# Patient Record
Sex: Female | Born: 1935 | Race: White | Hispanic: No | Marital: Single | Smoking: Never smoker
Health system: Southern US, Community
[De-identification: ages and names within clinical notes are randomized; demographics above are authoritative.]

## PROBLEM LIST (undated history)

## (undated) DIAGNOSIS — G2401 Drug induced subacute dyskinesia: Secondary | ICD-10-CM

## (undated) DIAGNOSIS — E079 Disorder of thyroid, unspecified: Secondary | ICD-10-CM

## (undated) DIAGNOSIS — E785 Hyperlipidemia, unspecified: Secondary | ICD-10-CM

## (undated) DIAGNOSIS — K219 Gastro-esophageal reflux disease without esophagitis: Secondary | ICD-10-CM

## (undated) DIAGNOSIS — F419 Anxiety disorder, unspecified: Secondary | ICD-10-CM

## (undated) DIAGNOSIS — H409 Unspecified glaucoma: Secondary | ICD-10-CM

## (undated) DIAGNOSIS — I10 Essential (primary) hypertension: Secondary | ICD-10-CM

## (undated) HISTORY — DX: Essential (primary) hypertension: I10

## (undated) HISTORY — PX: CATARACT EXTRACTION: SUR2

## (undated) HISTORY — DX: Gastro-esophageal reflux disease without esophagitis: K21.9

## (undated) HISTORY — PX: TUBAL LIGATION: SHX77

## (undated) HISTORY — DX: Hyperlipidemia, unspecified: E78.5

---

## 2004-08-08 ENCOUNTER — Ambulatory Visit: Payer: Self-pay | Admitting: Family Medicine

## 2006-02-03 ENCOUNTER — Ambulatory Visit: Payer: Self-pay | Admitting: Gastroenterology

## 2006-11-11 ENCOUNTER — Ambulatory Visit: Payer: Self-pay | Admitting: Family Medicine

## 2007-11-20 ENCOUNTER — Ambulatory Visit: Payer: Self-pay | Admitting: Family Medicine

## 2008-12-08 ENCOUNTER — Ambulatory Visit: Payer: Self-pay | Admitting: Family Medicine

## 2009-02-14 ENCOUNTER — Observation Stay: Payer: Self-pay | Admitting: Internal Medicine

## 2009-12-11 ENCOUNTER — Ambulatory Visit: Payer: Self-pay | Admitting: Family Medicine

## 2010-11-30 ENCOUNTER — Ambulatory Visit: Payer: Self-pay

## 2010-12-11 ENCOUNTER — Ambulatory Visit: Payer: Self-pay | Admitting: Family Medicine

## 2010-12-11 ENCOUNTER — Emergency Department: Payer: Self-pay | Admitting: Emergency Medicine

## 2010-12-25 ENCOUNTER — Ambulatory Visit: Payer: Self-pay | Admitting: Family Medicine

## 2012-01-27 ENCOUNTER — Ambulatory Visit: Payer: Self-pay | Admitting: Family Medicine

## 2012-06-10 ENCOUNTER — Ambulatory Visit: Payer: Self-pay | Admitting: Ophthalmology

## 2012-06-10 IMAGING — CT CT HEAD WITHOUT CONTRAST
1 series · 16 of 29 positions shown, 20 images · non-contrast
Comparison: none

REASON FOR EXAM: CR [id number]  recent fall now with headache vision changes
eval bleed
COMMENTS:

[Series 2: soft tissue · axial · 0.39mm/px · z∈[+874,+1004]mm · 16 of 29 slices shown, 20 images]
[im 2/29  brain]
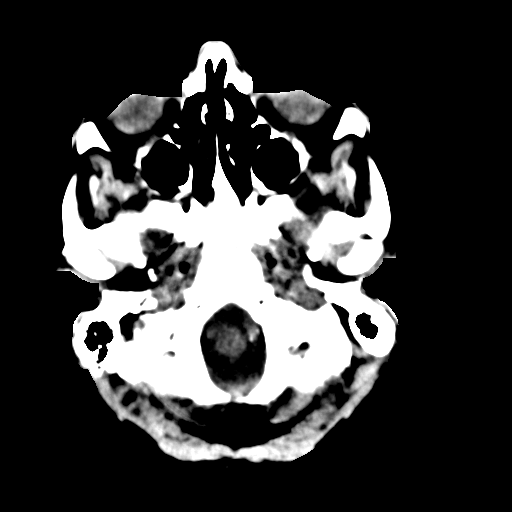
[im 2/29  bone]
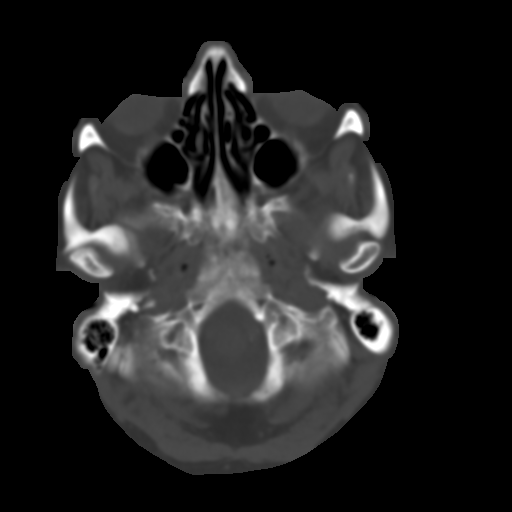
[im 4/29  brain]
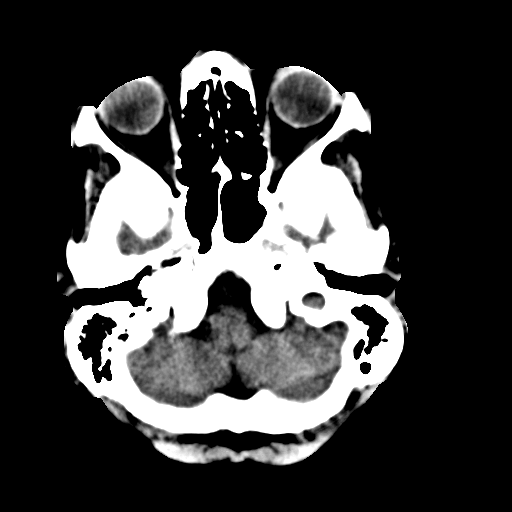
[im 6/29  brain]
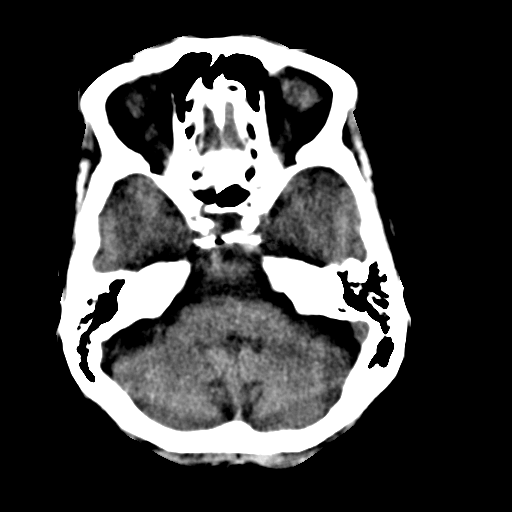
[im 7/29  brain]
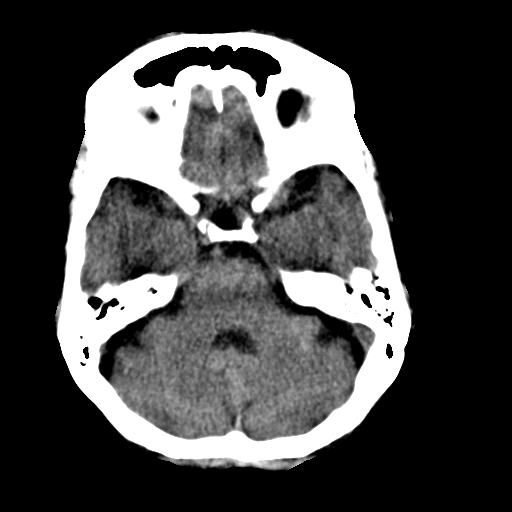
[im 9/29  brain]
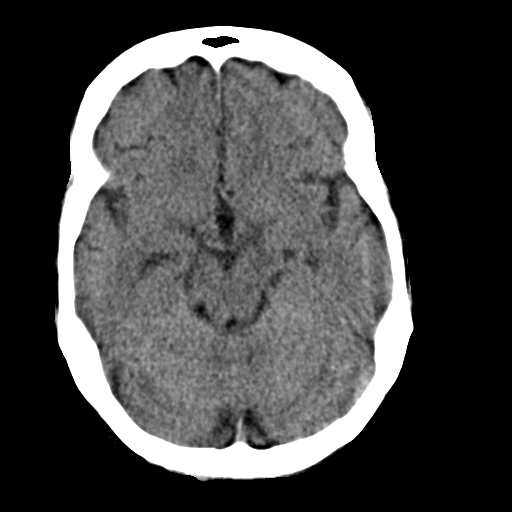
[im 9/29  bone]
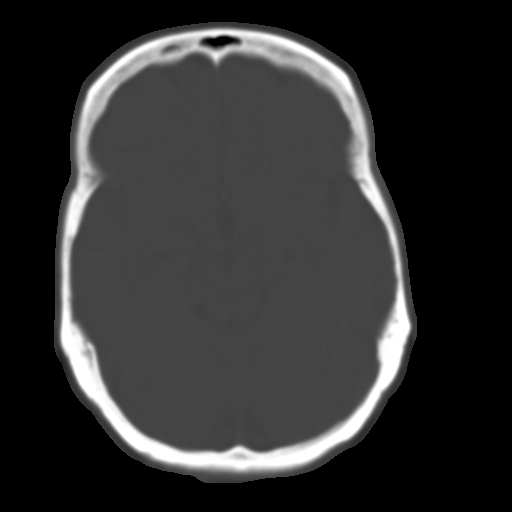
[im 11/29  brain]
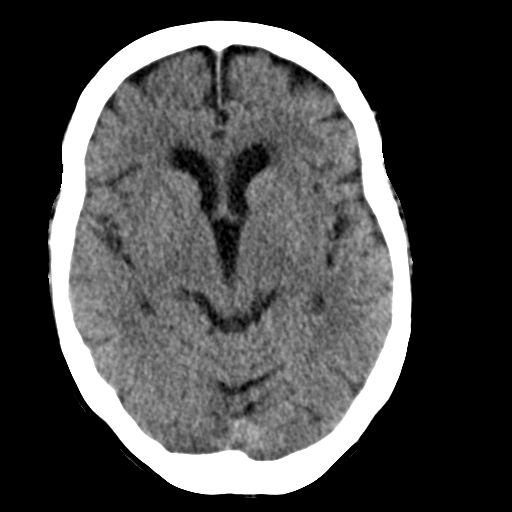
[im 12/29  brain]
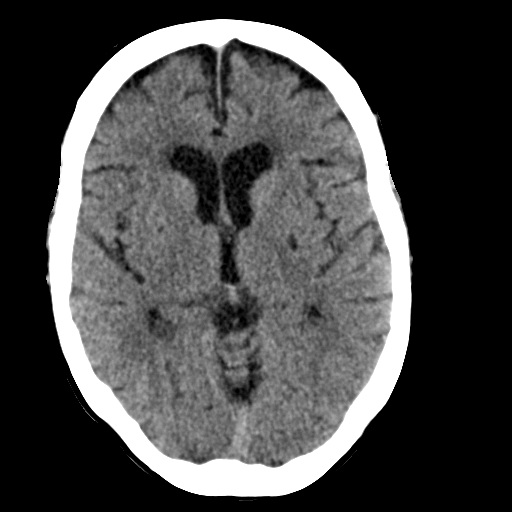
[im 14/29  brain]
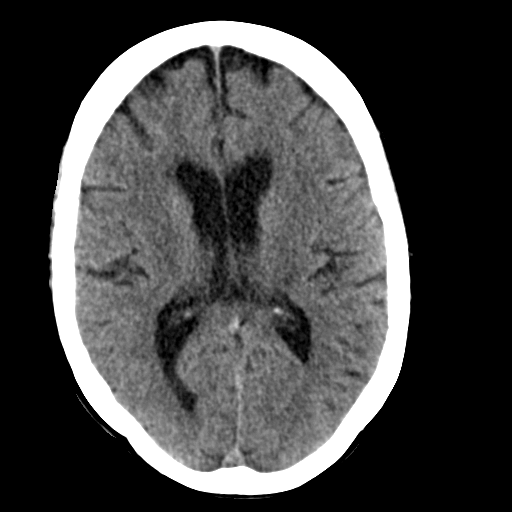
[im 16/29  brain]
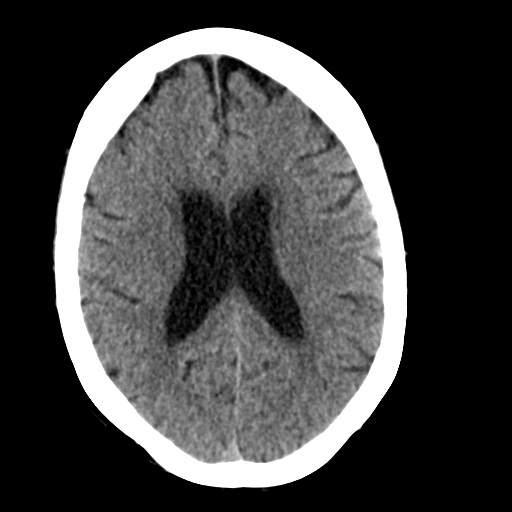
[im 16/29  bone]
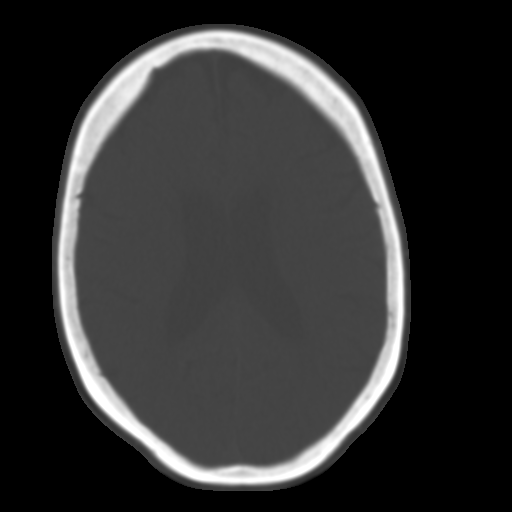
[im 18/29  brain]
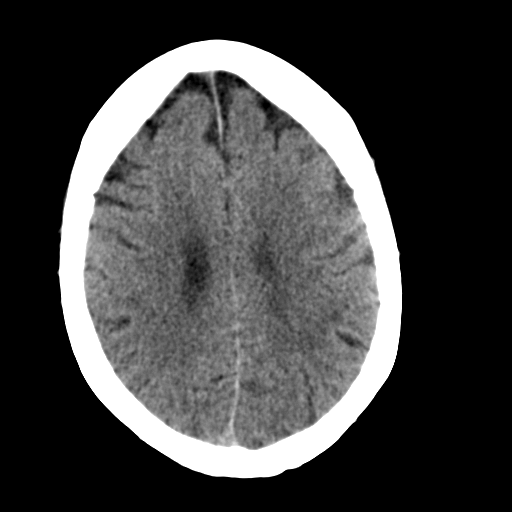
[im 19/29  brain]
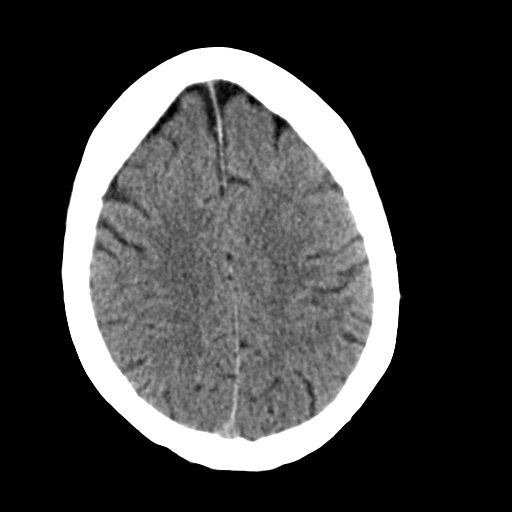
[im 21/29  brain]
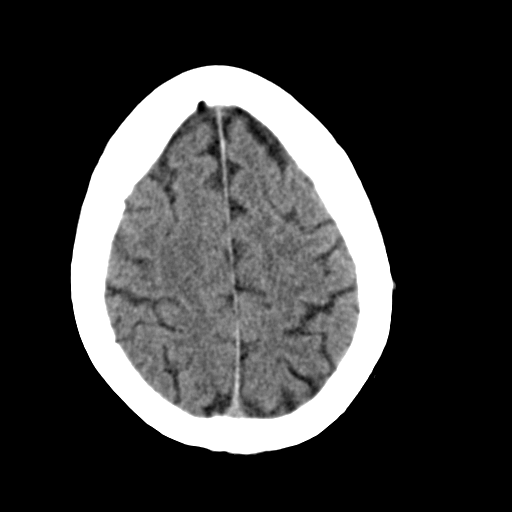
[im 23/29  brain]
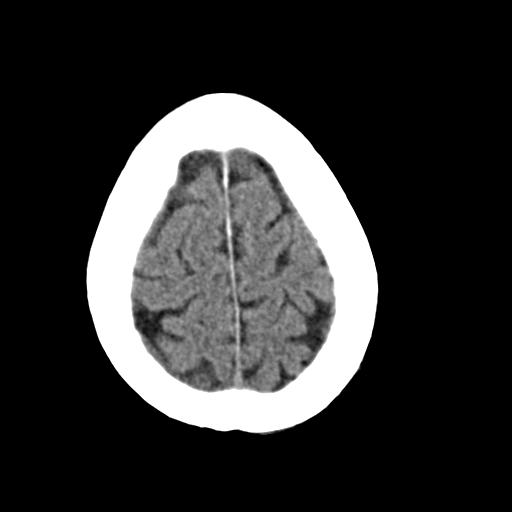
[im 23/29  bone]
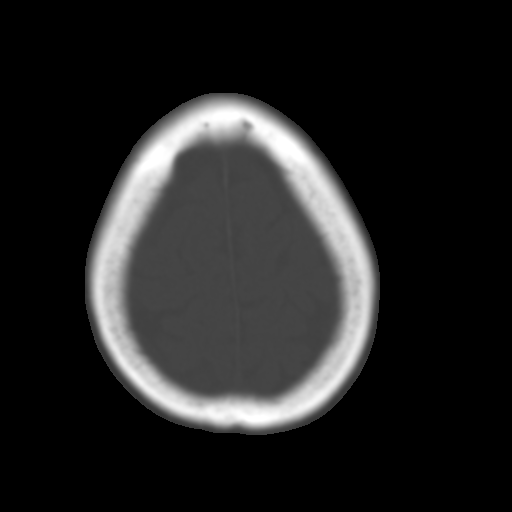
[im 24/29  brain]
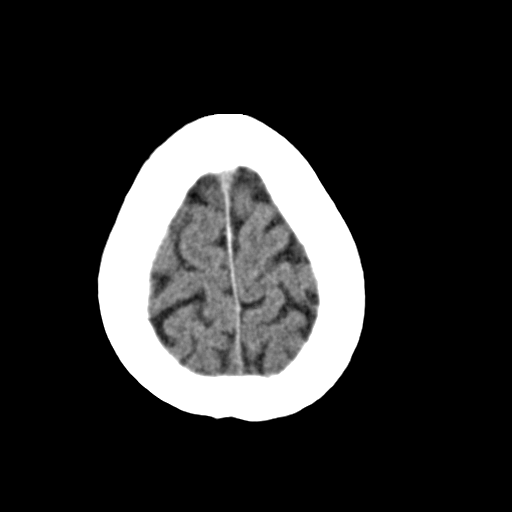
[im 26/29  brain]
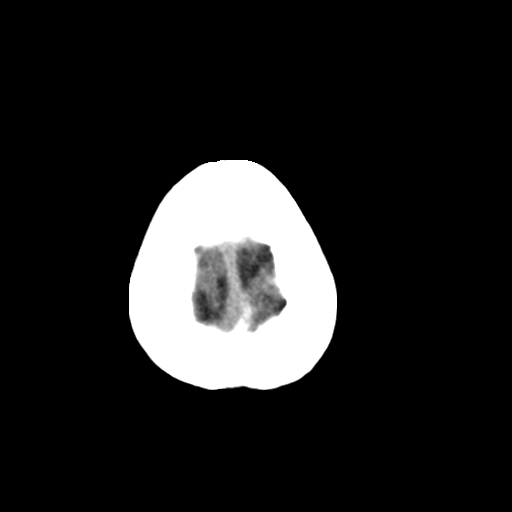
[im 28/29  brain]
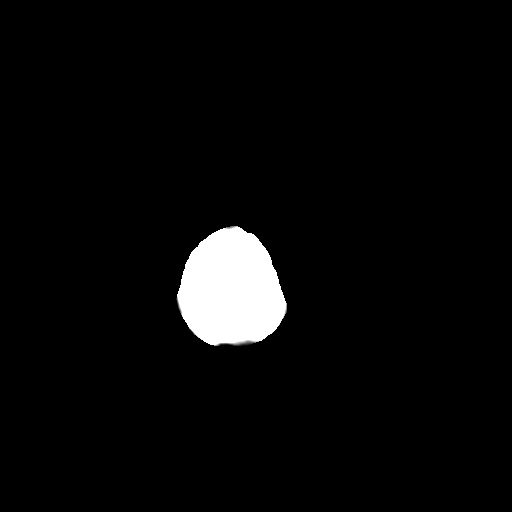

[16 of 29 positions shown; findings below may reference images not displayed]

PROCEDURE:     CT  - CT HEAD WITHOUT CONTRAST  - December 11, 2010 [DATE]

RESULT:     Noncontrast CT of the brain is performed in the standard
fashion. There is no previous exam for comparison.

There is mild diffuse prominence of the ventricles and sulci consistent with
atrophy appropriate for the patient's age. There is some mild
low-attenuation diffusely within the periventricular and subcortical white
matter which is nonspecific but most likely secondary to chronic small
vessel ischemic disease. There is no intracranial hemorrhage, mass effect or
midline shift. Small basal ganglia lacunar infarcts are present bilaterally.
The included sinuses and mastoid air cells demonstrate normal appearing
aeration. The calvarium is intact.
IMPRESSION: 1. Changes of atrophy with chronic small vessel ischemic disease. No acute
intracranial abnormality demonstrated.
2. Basal ganglia lacunar infarcts bilaterally.

## 2012-10-26 ENCOUNTER — Ambulatory Visit: Payer: Self-pay | Admitting: Family Medicine

## 2013-06-18 ENCOUNTER — Ambulatory Visit: Payer: Self-pay | Admitting: Family Medicine

## 2015-02-23 ENCOUNTER — Ambulatory Visit (INDEPENDENT_AMBULATORY_CARE_PROVIDER_SITE_OTHER): Payer: Commercial Managed Care - HMO | Admitting: Family Medicine

## 2015-02-23 ENCOUNTER — Telehealth: Payer: Self-pay | Admitting: Family Medicine

## 2015-02-23 ENCOUNTER — Encounter: Payer: Self-pay | Admitting: Family Medicine

## 2015-02-23 ENCOUNTER — Other Ambulatory Visit: Payer: Self-pay | Admitting: Family Medicine

## 2015-02-23 VITALS — BP 118/78 | HR 60 | Temp 97.7°F | Resp 16 | Ht 60.0 in | Wt 148.0 lb

## 2015-02-23 DIAGNOSIS — E039 Hypothyroidism, unspecified: Secondary | ICD-10-CM | POA: Diagnosis not present

## 2015-02-23 DIAGNOSIS — R296 Repeated falls: Secondary | ICD-10-CM

## 2015-02-23 DIAGNOSIS — E7800 Pure hypercholesterolemia, unspecified: Secondary | ICD-10-CM | POA: Insufficient documentation

## 2015-02-23 DIAGNOSIS — R739 Hyperglycemia, unspecified: Secondary | ICD-10-CM | POA: Insufficient documentation

## 2015-02-23 DIAGNOSIS — R0602 Shortness of breath: Secondary | ICD-10-CM | POA: Diagnosis not present

## 2015-02-23 DIAGNOSIS — K219 Gastro-esophageal reflux disease without esophagitis: Secondary | ICD-10-CM | POA: Insufficient documentation

## 2015-02-23 DIAGNOSIS — F32A Depression, unspecified: Secondary | ICD-10-CM | POA: Insufficient documentation

## 2015-02-23 DIAGNOSIS — N952 Postmenopausal atrophic vaginitis: Secondary | ICD-10-CM | POA: Insufficient documentation

## 2015-02-23 DIAGNOSIS — I10 Essential (primary) hypertension: Secondary | ICD-10-CM | POA: Diagnosis not present

## 2015-02-23 DIAGNOSIS — J309 Allergic rhinitis, unspecified: Secondary | ICD-10-CM | POA: Insufficient documentation

## 2015-02-23 DIAGNOSIS — G2401 Drug induced subacute dyskinesia: Secondary | ICD-10-CM

## 2015-02-23 DIAGNOSIS — M1990 Unspecified osteoarthritis, unspecified site: Secondary | ICD-10-CM | POA: Diagnosis not present

## 2015-02-23 DIAGNOSIS — F419 Anxiety disorder, unspecified: Secondary | ICD-10-CM | POA: Insufficient documentation

## 2015-02-23 DIAGNOSIS — F329 Major depressive disorder, single episode, unspecified: Secondary | ICD-10-CM | POA: Insufficient documentation

## 2015-02-23 DIAGNOSIS — H401190 Primary open-angle glaucoma, unspecified eye, stage unspecified: Secondary | ICD-10-CM | POA: Insufficient documentation

## 2015-02-23 DIAGNOSIS — G4700 Insomnia, unspecified: Secondary | ICD-10-CM | POA: Insufficient documentation

## 2015-02-23 DIAGNOSIS — E875 Hyperkalemia: Secondary | ICD-10-CM | POA: Insufficient documentation

## 2015-02-23 HISTORY — DX: Drug induced subacute dyskinesia: G24.01

## 2015-02-23 NOTE — Telephone Encounter (Signed)
TODAY AT 2:30

## 2015-02-23 NOTE — Progress Notes (Signed)
Subjective:    Patient ID: Katrina Guerrero, female    DOB: Jun 04, 1929, 81 y.o.   MRN: [id number]  HPI Comments: Pt has fallen 6-7 times in the last month. Pt states she is experiencing fatigue. States, "I have no energy. It's hard to pick up my leg". Pt's right leg is bothering her more than the left.  Fall The accident occurred 12 to 24 hours ago. She landed on carpet. There was no blood loss. The point of impact was the left shoulder. The patient is experiencing no pain. Pertinent negatives include no abdominal pain, bowel incontinence, fever, headaches, hematuria, loss of consciousness, nausea, numbness, tingling, visual change or vomiting.   Feels fine after event. Cognitively intact.      Review of Systems  Constitutional: Positive for fatigue. Negative for fever, chills, diaphoresis, activity change and appetite change.  Respiratory: Positive for shortness of breath (with exertion). Negative for cough.   Cardiovascular: Negative for chest pain.  Gastrointestinal: Negative for nausea, vomiting, abdominal pain and bowel incontinence.  Genitourinary: Negative for hematuria.  Neurological: Negative for tingling, loss of consciousness, numbness and headaches.   Patient Active Problem List   Diagnosis Date Noted  . Allergic rhinitis 02/23/2015  . Anxiety 02/23/2015  . Atrophic vaginitis 02/23/2015  . Chronic glaucoma 02/23/2015  . Clinical depression 02/23/2015  . Acid reflux 02/23/2015  . BP (high blood pressure) 02/23/2015  . Hypercholesteremia 02/23/2015  . Blood glucose elevated 02/23/2015  . High potassium 02/23/2015  . Adult hypothyroidism 02/23/2015  . Cannot sleep 02/23/2015  . Dyskinesia, tardive 02/23/2015  .   History reviewed. No pertinent past medical history. Past Surgical History  Procedure Laterality Date  . Tubal ligation      reports that she has never smoked. She has never used smokeless tobacco. She reports that she does not drink alcohol or use  illicit drugs. family history includes Emphysema in her brother; Heart attack in her father; Hypertension in her mother. Allergies  Allergen Reactions  . Codeine   . Furosemide     lowers blood pressure  . Levofloxacin     Other reaction(s): Dizziness Weakness    Objective:   Physical Exam  Constitutional: She is oriented to person, place, and time. She appears well-developed and well-nourished.  Musculoskeletal: She exhibits edema (+ 1 pitting edema).  Some decreased ROM at hips and knees. Strength decreased, 4 plus at arms and legs. In wheelchair today.   Neurological: She is alert and oriented to person, place, and time.  Psychiatric: She has a normal mood and affect.     Blood pressure 118/78, pulse 60, temperature 97.7 F (36.5 C), temperature source Oral, resp. rate 16, height 5' (1.524 m), weight 148 lb (67.132 kg).      Assessment & Plan:  1. Frequent falls Worsening. Pt has tried physical in the past ([place]). Will refer back to PT. Decrease Mirtazipine to 1 po qhs, and decrease Buspar to 1 tab in the afternoon, and 2 at night. Encouraged daughter to consider residential assisted living.    - Ambulatory referral to Physical Therapy  2. Essential hypertension Stable. FU pending labs.  - Comp Met (CMET) - CBC w/Diff  3. Hypothyroidism, unspecified hypothyroidism type FU pending labs.  - TSH  4. Arthritis FU pending labs.  - Sed Rate (ESR)  5. Shortness of breath FU pending labs.  - B Nat Peptide    Patient seen and examined by Jerrell Belfast, MD, and note scribed by  Renaldo Fiddler, CMA.  I have reviewed the document for accuracy and completeness and I agree with above. Jerrell Belfast, MD

## 2015-02-23 NOTE — Telephone Encounter (Signed)
Pt daughter, Eunice Blase called stating pt has fallen 4 times in the last 2 weeks.  Pt daughter was wanting to get pt in to see you if possible.  When can you work pt in?  CB#[phone]/MJ

## 2015-02-23 NOTE — Telephone Encounter (Signed)
Marcelino Duster had already advised Stanton Kidney of the appt today at 2:30. Thanks TNP

## 2015-02-23 NOTE — Telephone Encounter (Signed)
Pt has been scheduled today at 2:30pm. I LMTCB for [place]. Thanks TNP

## 2015-02-24 ENCOUNTER — Telehealth: Payer: Self-pay

## 2015-02-24 LAB — BRAIN NATRIURETIC PEPTIDE: BNP: 169.1 pg/mL — ABNORMAL HIGH (ref 0.0–100.0)

## 2015-02-24 LAB — COMPREHENSIVE METABOLIC PANEL
ALBUMIN: 3.9 g/dL (ref 3.5–4.7)
ALT: 9 IU/L (ref 0–32)
AST: 17 IU/L (ref 0–40)
Albumin/Globulin Ratio: 2.3 (ref 1.1–2.5)
Alkaline Phosphatase: 55 IU/L (ref 39–117)
BUN / CREAT RATIO: 20 (ref 11–26)
BUN: 17 mg/dL (ref 8–27)
Bilirubin Total: 0.3 mg/dL (ref 0.0–1.2)
CO2: 21 mmol/L (ref 18–29)
CREATININE: 0.84 mg/dL (ref 0.57–1.00)
Calcium: 9 mg/dL (ref 8.7–10.3)
Chloride: 93 mmol/L — ABNORMAL LOW (ref 97–108)
GFR calc Af Amer: 74 mL/min/{1.73_m2} (ref >59)
GFR calc non Af Amer: 64 mL/min/{1.73_m2} (ref >59)
Globulin, Total: 1.7 g/dL (ref 1.5–4.5)
Glucose: 106 mg/dL — ABNORMAL HIGH (ref 65–99)
Potassium: 5.4 mmol/L — ABNORMAL HIGH (ref 3.5–5.2)
Sodium: 129 mmol/L — ABNORMAL LOW (ref 134–144)
Total Protein: 5.6 g/dL — ABNORMAL LOW (ref 6.0–8.5)

## 2015-02-24 LAB — CBC WITH DIFFERENTIAL/PLATELET
BASOS ABS: 0 10*3/uL (ref 0.0–0.2)
Basos: 0 %
EOS (ABSOLUTE): 0.1 10*3/uL (ref 0.0–0.4)
EOS: 2 %
Hematocrit: 32.9 % — ABNORMAL LOW (ref 34.0–46.6)
Hemoglobin: 11.1 g/dL (ref 11.1–15.9)
IMMATURE GRANS (ABS): 0 10*3/uL (ref 0.0–0.1)
Immature Granulocytes: 0 %
LYMPHS ABS: 1.7 10*3/uL (ref 0.7–3.1)
Lymphs: 34 %
MCH: 29.7 pg (ref 26.6–33.0)
MCHC: 33.7 g/dL (ref 31.5–35.7)
MCV: 88 fL (ref 79–97)
Monocytes Absolute: 0.7 10*3/uL (ref 0.1–0.9)
Monocytes: 13 %
NEUTROS ABS: 2.5 10*3/uL (ref 1.4–7.0)
Neutrophils: 51 %
Platelets: 225 10*3/uL (ref 150–379)
RBC: 3.74 x10E6/uL — AB (ref 3.77–5.28)
RDW: 13.8 % (ref 12.3–15.4)
WBC: 4.9 10*3/uL (ref 3.4–10.8)

## 2015-02-24 LAB — SEDIMENTATION RATE: Sed Rate: 2 mm/hr (ref 0–40)

## 2015-02-24 LAB — TSH: TSH: 2.76 u[IU]/mL (ref 0.450–4.500)

## 2015-02-24 NOTE — Telephone Encounter (Signed)
Informed pt's daughter, Stanton Kidney, of lab results. Daughter verbally acknowledges understanding and is in agreement with treatment plan. Allene Dillon, CMA

## 2015-02-24 NOTE — Telephone Encounter (Signed)
-----   Message from Lorie Phenix, MD sent at 02/24/2015  1:16 PM EDT ----- Labs stable except for mildly low sodium and increased potassium. Make sure not using any salt substitutes, limit fluids to 2 liters a day and recheck in one week. Unclear if cause of weakness, but probably not.  Please notify patients daughter Thanks.

## 2015-02-28 ENCOUNTER — Telehealth: Payer: Self-pay | Admitting: Family Medicine

## 2015-02-28 DIAGNOSIS — E871 Hypo-osmolality and hyponatremia: Secondary | ICD-10-CM

## 2015-02-28 DIAGNOSIS — E875 Hyperkalemia: Secondary | ICD-10-CM

## 2015-02-28 NOTE — Telephone Encounter (Signed)
Printed off lab slip for Met C recheck per pt message in chart from 02/24/2015. Informed Debbie. Renaldo Fiddler, CMA

## 2015-02-28 NOTE — Telephone Encounter (Signed)
Pt daughter, Eunice Blase is requesting a lab slip for a recheck.  CB#[phone]/MJ

## 2015-03-02 ENCOUNTER — Other Ambulatory Visit: Payer: Self-pay

## 2015-03-02 DIAGNOSIS — E039 Hypothyroidism, unspecified: Secondary | ICD-10-CM

## 2015-03-02 MED ORDER — LEVOTHYROXINE SODIUM 50 MCG PO TABS: 50.0000 ug | ORAL_TABLET | Freq: Every day | ORAL | Status: AC

## 2015-03-03 ENCOUNTER — Telehealth: Payer: Self-pay

## 2015-03-03 LAB — COMPREHENSIVE METABOLIC PANEL
ALT: 7 IU/L (ref 0–32)
AST: 15 IU/L (ref 0–40)
Albumin/Globulin Ratio: 2.3 (ref 1.1–2.5)
Albumin: 3.9 g/dL (ref 3.5–4.7)
Alkaline Phosphatase: 59 IU/L (ref 39–117)
BUN/Creatinine Ratio: 18 (ref 11–26)
BUN: 14 mg/dL (ref 8–27)
Bilirubin Total: 0.3 mg/dL (ref 0.0–1.2)
CHLORIDE: 96 mmol/L — AB (ref 97–108)
CO2: 23 mmol/L (ref 18–29)
CREATININE: 0.79 mg/dL (ref 0.57–1.00)
Calcium: 8.7 mg/dL (ref 8.7–10.3)
GFR calc Af Amer: 80 mL/min/{1.73_m2} (ref >59)
GFR calc non Af Amer: 69 mL/min/{1.73_m2} (ref >59)
GLOBULIN, TOTAL: 1.7 g/dL (ref 1.5–4.5)
Glucose: 110 mg/dL — ABNORMAL HIGH (ref 65–99)
Potassium: 5.1 mmol/L (ref 3.5–5.2)
SODIUM: 133 mmol/L — AB (ref 134–144)
Total Protein: 5.6 g/dL — ABNORMAL LOW (ref 6.0–8.5)

## 2015-03-03 NOTE — Telephone Encounter (Signed)
Informed pt's daughter as below. Agrees with treatment plan. Allene Dillon, CMA

## 2015-03-03 NOTE — Telephone Encounter (Signed)
LMTCB

## 2015-03-03 NOTE — Telephone Encounter (Signed)
Think that is an excellent idea. Is to prevent bladder infections, but think trial off is a good idea. Please notify daughter. Thanks.

## 2015-03-03 NOTE — Telephone Encounter (Signed)
-----   Message from Lorie Phenix, MD sent at 03/03/2015  8:48 AM EDT ----- Labs improved.   Continue current medication and recheck in 3 months. Thanks.

## 2015-03-03 NOTE — Telephone Encounter (Signed)
Informed pt's daughter as below. Daughter wanted to inform you that a home-health care nurse advised pt that Trimethoprim, along with another one of her medications, could be causing pt's potassium to elevated. Daughter is questioning if the Trimethoprim is a necessary medication? Please advise. CB# [phone]. Allene Dillon, CMA

## 2015-03-13 ENCOUNTER — Encounter (INDEPENDENT_AMBULATORY_CARE_PROVIDER_SITE_OTHER): Payer: Commercial Managed Care - HMO | Admitting: Family Medicine

## 2015-03-13 DIAGNOSIS — M6281 Muscle weakness (generalized): Secondary | ICD-10-CM

## 2015-03-13 DIAGNOSIS — M1711 Unilateral primary osteoarthritis, right knee: Secondary | ICD-10-CM | POA: Diagnosis not present

## 2015-03-13 DIAGNOSIS — R2681 Unsteadiness on feet: Secondary | ICD-10-CM | POA: Diagnosis not present

## 2015-03-13 DIAGNOSIS — R296 Repeated falls: Secondary | ICD-10-CM | POA: Diagnosis not present

## 2015-03-22 ENCOUNTER — Other Ambulatory Visit: Payer: Self-pay

## 2015-03-22 DIAGNOSIS — K219 Gastro-esophageal reflux disease without esophagitis: Secondary | ICD-10-CM

## 2015-03-22 MED ORDER — OMEPRAZOLE 20 MG PO CPDR: 20.0000 mg | DELAYED_RELEASE_CAPSULE | Freq: Every day | ORAL | Status: DC

## 2015-03-28 ENCOUNTER — Telehealth: Payer: Self-pay | Admitting: Family Medicine

## 2015-03-28 DIAGNOSIS — K219 Gastro-esophageal reflux disease without esophagitis: Secondary | ICD-10-CM

## 2015-03-28 NOTE — Telephone Encounter (Signed)
Pt's daughter Eunice BlaseDebbie would like to speak with a nurse because she thinks the dosage changed for pt's omeprazole (PRILOSEC) 20 MG capsule and just wanted to make sure. Thanks TNP

## 2015-03-28 NOTE — Telephone Encounter (Signed)
Debbie advised that Katrina Guerrero is supposed to take Omeprazole 20mg  1 Twice a day.    Thanks,   -Vernona RiegerLaura

## 2015-03-28 NOTE — Addendum Note (Signed)
Addended by: Kavin LeechWALSH, LAURA E on: 03/28/2015 04:14 PM   Modules accepted: Orders

## 2015-04-13 ENCOUNTER — Telehealth: Payer: Self-pay | Admitting: Family Medicine

## 2015-04-13 DIAGNOSIS — R739 Hyperglycemia, unspecified: Secondary | ICD-10-CM

## 2015-04-13 NOTE — Telephone Encounter (Signed)
Pt daughter is requesting a referral to have pt nails cut.  CB#[phone]/MW

## 2015-04-21 ENCOUNTER — Telehealth: Payer: Self-pay | Admitting: Family Medicine

## 2015-04-21 NOTE — Telephone Encounter (Signed)
Called states she sent paper for recert on PT and hasnt heard back.  Please call her at [phone]  Thanks Barth Kirks

## 2015-04-21 NOTE — Telephone Encounter (Signed)
Ok to recert. Please see if they will take a verbal. Thanks.

## 2015-04-21 NOTE — Telephone Encounter (Signed)
Left message to call back  

## 2015-04-24 ENCOUNTER — Other Ambulatory Visit: Payer: Self-pay

## 2015-04-24 DIAGNOSIS — K219 Gastro-esophageal reflux disease without esophagitis: Secondary | ICD-10-CM

## 2015-04-24 MED ORDER — OMEPRAZOLE 20 MG PO CPDR: 20.0000 mg | DELAYED_RELEASE_CAPSULE | Freq: Two times a day (BID) | ORAL | Status: DC

## 2015-04-26 NOTE — Telephone Encounter (Signed)
Katrina Guerrero reports that she has taken the verbal order.

## 2015-05-03 ENCOUNTER — Other Ambulatory Visit: Payer: Self-pay

## 2015-05-03 DIAGNOSIS — I10 Essential (primary) hypertension: Secondary | ICD-10-CM

## 2015-05-03 MED ORDER — LISINOPRIL 10 MG PO TABS: 10.0000 mg | ORAL_TABLET | Freq: Every day | ORAL | Status: DC

## 2015-05-03 NOTE — Telephone Encounter (Signed)
Sent prescription to pharmacy as ordered per MD, Sullivan Lone.

## 2015-05-03 NOTE — Telephone Encounter (Signed)
Last ov was on 03/13/2015. This is a pt of Dr. Santiago Bur.  Thanks,

## 2015-05-12 DIAGNOSIS — M6281 Muscle weakness (generalized): Secondary | ICD-10-CM | POA: Diagnosis not present

## 2015-05-12 DIAGNOSIS — R296 Repeated falls: Secondary | ICD-10-CM | POA: Diagnosis not present

## 2015-05-12 DIAGNOSIS — M1711 Unilateral primary osteoarthritis, right knee: Secondary | ICD-10-CM | POA: Diagnosis not present

## 2015-05-12 DIAGNOSIS — R2681 Unsteadiness on feet: Secondary | ICD-10-CM | POA: Diagnosis not present

## 2015-06-08 ENCOUNTER — Other Ambulatory Visit: Payer: Self-pay | Admitting: Family Medicine

## 2015-06-08 DIAGNOSIS — F419 Anxiety disorder, unspecified: Secondary | ICD-10-CM

## 2015-06-08 MED ORDER — BUSPIRONE HCL 10 MG PO TABS: ORAL_TABLET | ORAL | Status: DC

## 2015-06-08 NOTE — Telephone Encounter (Signed)
Pt contacted office for refill request on the following medications:  busPIRone (BUSPAR) 10 MG. [facility].  CB#[phone]/MW

## 2015-06-16 ENCOUNTER — Encounter: Payer: Self-pay | Admitting: Family Medicine

## 2015-06-16 ENCOUNTER — Ambulatory Visit (INDEPENDENT_AMBULATORY_CARE_PROVIDER_SITE_OTHER): Payer: Commercial Managed Care - HMO | Admitting: Family Medicine

## 2015-06-16 VITALS — BP 126/60 | HR 60 | Temp 98.1°F | Resp 16

## 2015-06-16 DIAGNOSIS — Z0000 Encounter for general adult medical examination without abnormal findings: Secondary | ICD-10-CM | POA: Diagnosis not present

## 2015-06-16 DIAGNOSIS — E7800 Pure hypercholesterolemia, unspecified: Secondary | ICD-10-CM

## 2015-06-16 DIAGNOSIS — G4700 Insomnia, unspecified: Secondary | ICD-10-CM | POA: Diagnosis not present

## 2015-06-16 DIAGNOSIS — R739 Hyperglycemia, unspecified: Secondary | ICD-10-CM

## 2015-06-16 DIAGNOSIS — E039 Hypothyroidism, unspecified: Secondary | ICD-10-CM

## 2015-06-16 DIAGNOSIS — Z23 Encounter for immunization: Secondary | ICD-10-CM | POA: Diagnosis not present

## 2015-06-16 DIAGNOSIS — I10 Essential (primary) hypertension: Secondary | ICD-10-CM

## 2015-06-16 MED ORDER — MIRTAZAPINE 15 MG PO TABS: 15.0000 mg | ORAL_TABLET | Freq: Every day | ORAL | Status: AC

## 2015-06-16 NOTE — Patient Instructions (Signed)
Decrease Remeron to 15 mg daily at bedtime.

## 2015-06-16 NOTE — Progress Notes (Signed)
Patient ID: Katrina Guerrero, female   DOB: 01/20/1929, 81 y.o.   MRN: [id number]        Patient: Katrina Guerrero, Female    DOB: 09-20-1928, 81 y.o.   MRN: [id number] Visit Date: 06/16/2015  Today's Provider: Lorie Phenix, MD   Chief Complaint  Patient presents with  . Medicare Wellness   Subjective:    Annual wellness visit Katrina Guerrero is a 81 y.o. female. She feels well. She reports exercising 5-6 days with PT. She reports she is sleeping fairly well.  05/12/14 CPE 01/11/10 Pap-neg 06/18/13-Mammo-BI-RADS 1 02/03/06 Colon-Erythematous mucosa 05/01/11 BMD-normal 11/30/10 EKG  Lab Results  Component Value Date   WBC 4.9 02/23/2015   HCT 32.9* 02/23/2015   GLUCOSE 110* 03/02/2015   ALT 7 03/02/2015   AST 15 03/02/2015   NA 133* 03/02/2015   K 5.1 03/02/2015   CL 96* 03/02/2015   CREATININE 0.79 03/02/2015   BUN 14 03/02/2015   CO2 23 03/02/2015   TSH 2.760 02/23/2015    -----------------------------------------------------------   Review of Systems  Constitutional: Positive for fatigue.  HENT: Negative.   Eyes: Negative.   Respiratory: Positive for shortness of breath.   Cardiovascular: Negative.   Gastrointestinal: Negative.   Endocrine: Negative.   Genitourinary: Negative.   Musculoskeletal: Negative.   Skin: Negative.   Allergic/Immunologic: Negative.   Neurological: Negative.   Hematological: Negative.   Psychiatric/Behavioral: Negative.     Social History   Social History  . Marital Status: Single    Spouse Name: N/A  . Number of Children: N/A  . Years of Education: N/A   Occupational History  . Not on file.   Social History Main Topics  . Smoking status: Never Smoker   . Smokeless tobacco: Never Used  . Alcohol Use: No  . Drug Use: No  . Sexual Activity: Not on file   Other Topics Concern  . Not on file   Social History Narrative    Patient Active Problem List   Diagnosis Date Noted  . Allergic rhinitis 02/23/2015  .  Anxiety 02/23/2015  . Atrophic vaginitis 02/23/2015  . Chronic glaucoma 02/23/2015  . Clinical depression 02/23/2015  . Acid reflux 02/23/2015  . BP (high blood pressure) 02/23/2015  . Hypercholesteremia 02/23/2015  . Blood glucose elevated 02/23/2015  . High potassium 02/23/2015  . Adult hypothyroidism 02/23/2015  . Cannot sleep 02/23/2015  . Dyskinesia, tardive 02/23/2015    Past Surgical History  Procedure Laterality Date  . Tubal ligation      Her family history includes Emphysema in her brother; Heart attack in her father; Hypertension in her mother.    Previous Medications   ACETAMINOPHEN (TYLENOL) 500 MG TABLET    Take 1,000 mg by mouth every 6 (six) hours as needed.   ASPIRIN 81 MG TABLET    Take 81 mg by mouth daily.    BUSPIRONE (BUSPAR) 10 MG TABLET    1 tablet mid-day and then 2 at night.   FELODIPINE (PLENDIL) 5 MG 24 HR TABLET    Take 5 mg by mouth daily.    LEVOTHYROXINE (LEVOXYL) 50 MCG TABLET    Take 1 tablet (50 mcg total) by mouth daily before breakfast.   LISINOPRIL (PRINIVIL,ZESTRIL) 10 MG TABLET    Take 1 tablet (10 mg total) by mouth daily.   MENTHOL-METHYL SALICYLATE (MUSCLE RUB EX)       MIRTAZAPINE (REMERON) 30 MG TABLET    Take 1 tablet by mouth every evening.  MULTIPLE VITAMIN (MULTI-VITAMINS) TABS    Take 1 tablet by mouth daily.   NYSTATIN (MYCOSTATIN/NYSTOP) 100000 UNIT/GM POWD       OMEPRAZOLE (PRILOSEC) 20 MG CAPSULE    Take 1 capsule (20 mg total) by mouth 2 (two) times daily.   PRAVASTATIN (PRAVACHOL) 10 MG TABLET    Take by mouth.   TIMOLOL (TIMOPTIC) 0.5 % OPHTHALMIC SOLUTION    Apply to eye.   TRIMETHOPRIM (TRIMPEX) 100 MG TABLET    Take 1 tablet by mouth 2 (two) times daily.   UNABLE TO FIND    Adult incontinence underwear L    Patient Care Team: Lorie Phenix, MD as PCP - General (Family Medicine)     Objective:   Vitals: BP 126/60 mmHg  Pulse 60  Temp(Src) 98.1 F (36.7 C) (Oral)  Resp 16  Ht   Wt   SpO2 100%  Physical  Exam  Constitutional: She is oriented to person, place, and time. She appears well-developed and well-nourished.  HENT:  Head: Normocephalic and atraumatic.  Right Ear: Tympanic membrane, external ear and ear canal normal.  Left Ear: Tympanic membrane, external ear and ear canal normal.  Nose: Nose normal.  Mouth/Throat: Uvula is midline, oropharynx is clear and moist and mucous membranes are normal.  Eyes: Conjunctivae, EOM and lids are normal. Pupils are equal, round, and reactive to light.  Neck: Trachea normal and normal range of motion. Neck supple. Carotid bruit is not present. No thyroid mass and no thyromegaly present.  Cardiovascular: Normal rate, regular rhythm and normal heart sounds.   Pulmonary/Chest: Effort normal and breath sounds normal.  Abdominal: Soft. Normal appearance and bowel sounds are normal. There is no hepatosplenomegaly. There is no tenderness.  Genitourinary: No breast swelling, tenderness or discharge.  Musculoskeletal: Normal range of motion.  Lymphadenopathy:    She has no cervical adenopathy.    She has no axillary adenopathy.  Neurological: She is alert and oriented to person, place, and time. She has normal strength. No cranial nerve deficit.  Skin: Skin is warm, dry and intact.  Psychiatric: She has a normal mood and affect. Her speech is normal and behavior is normal. Judgment and thought content normal. Cognition and memory are normal.    Activities of Daily Living In your present state of health, do you have any difficulty performing the following activities: 06/16/2015 02/23/2015  Hearing? Malvin Johns  Vision? N N  Difficulty concentrating or making decisions? N Y  Walking or climbing stairs? Y Y  Dressing or bathing? Y Y  Doing errands, shopping? Malvin Johns    Fall Risk Assessment Fall Risk  06/16/2015 02/23/2015  Falls in the past year? Yes Yes  Number falls in past yr: 2 or more 2 or more  Injury with Fall? No No  Risk Factor Category  - High Fall Risk    Risk for fall due to : - History of fall(s);Impaired balance/gait;Impaired mobility  Follow up - Follow up appointment     Depression Screen PHQ 2/9 Scores 06/16/2015 02/23/2015  PHQ - 2 Score 0 0    Cognitive Testing - 6-CIT  Correct? Score   What year is it? yes 0 0 or 4  What month is it? yes 0 0 or 3  Memorize:    Floyde Parkins,  44,  [address],  [place],      What time is it? (within 1 hour) yes 0 0 or 3  Count backwards from 20 yes 0 0, 2, or  4  Name the months of the year yes 0 0, 2, or 4  Repeat name & address above yes 0 0, 2, 4, 6, 8, or 10       TOTAL SCORE  0/28   Interpretation:  Normal  Normal (0-7) Abnormal (8-28)       Assessment & Plan:     Annual Wellness Visit  Reviewed patient's Family Medical History Reviewed and updated list of patient's medical providers Assessment of cognitive impairment was done Assessed patient's functional ability Established a written schedule for health screening services Health Risk Assessent Completed and Reviewed  Exercise Activities and Dietary recommendations Goals    . Exercise 150 minutes per week (moderate activity)       Immunization History  Administered Date(s) Administered  . Pneumococcal Conjugate-13 08/29/2014  . Zoster 11/18/2006    Health Maintenance  Topic Date Due  . Janet Berlin  05/09/1950  . DEXA SCAN  05/09/1996  . INFLUENZA VACCINE  04/17/2015  . PNA vac Low Risk Adult (2 of 2 - PPSV23) 08/30/2015  . ZOSTAVAX  Completed     1. Medicare annual wellness visit, subsequent Stable. Patient advised to continue eating healthy and exercise daily.  2. Need for influenza vaccination - Flu vaccine HIGH DOSE PF  3. Essential hypertension - CBC with Differential/Platelet - Comprehensive metabolic panel  4. Hypothyroidism, unspecified hypothyroidism type - TSH  5. Blood glucose elevated - Hemoglobin A1c  6. Cannot sleep Not to goal. Patient advised to decrease mirtazapine 15 mg as below.   Patient instructed to call back if condition worsens or does not improve.    - mirtazapine (REMERON) 15 MG tablet; Take 1 tablet (15 mg total) by mouth at bedtime.  Dispense: 30 tablet; Refill: 5  7. Hypercholesteremia - Lipid Panel With LDL/HDL Ratio    Patient seen and examined by Dr. Leo Grosser, and note scribed by Liz Beach. Dimas, CMA.  I have reviewed the document for accuracy and completeness and I agree with above. Leo Grosser, MD   Lorie Phenix, MD         ------------------------------------------------------------------------------------------------------------

## 2015-06-17 LAB — LIPID PANEL WITH LDL/HDL RATIO
Cholesterol, Total: 154 mg/dL (ref 100–199)
HDL: 49 mg/dL (ref >39)
LDL CALC: 77 mg/dL (ref 0–99)
LDl/HDL Ratio: 1.6 ratio units (ref 0.0–3.2)
Triglycerides: 140 mg/dL (ref 0–149)
VLDL CHOLESTEROL CAL: 28 mg/dL (ref 5–40)

## 2015-06-17 LAB — CBC WITH DIFFERENTIAL/PLATELET
BASOS: 1 %
Basophils Absolute: 0 10*3/uL (ref 0.0–0.2)
EOS (ABSOLUTE): 0.1 10*3/uL (ref 0.0–0.4)
EOS: 2 %
HEMATOCRIT: 37.1 % (ref 34.0–46.6)
Hemoglobin: 12.6 g/dL (ref 11.1–15.9)
IMMATURE GRANULOCYTES: 0 %
Immature Grans (Abs): 0 10*3/uL (ref 0.0–0.1)
Lymphocytes Absolute: 1.3 10*3/uL (ref 0.7–3.1)
Lymphs: 27 %
MCH: 29.2 pg (ref 26.6–33.0)
MCHC: 34 g/dL (ref 31.5–35.7)
MCV: 86 fL (ref 79–97)
MONOS ABS: 0.4 10*3/uL (ref 0.1–0.9)
Monocytes: 8 %
NEUTROS ABS: 3.1 10*3/uL (ref 1.4–7.0)
NEUTROS PCT: 62 %
Platelets: 250 10*3/uL (ref 150–379)
RBC: 4.32 x10E6/uL (ref 3.77–5.28)
RDW: 13.5 % (ref 12.3–15.4)
WBC: 5 10*3/uL (ref 3.4–10.8)

## 2015-06-17 LAB — COMPREHENSIVE METABOLIC PANEL
A/G RATIO: 2.5 (ref 1.1–2.5)
ALT: 9 IU/L (ref 0–32)
AST: 15 IU/L (ref 0–40)
Albumin: 4.2 g/dL (ref 3.5–4.7)
Alkaline Phosphatase: 60 IU/L (ref 39–117)
BUN/Creatinine Ratio: 16 (ref 11–26)
BUN: 13 mg/dL (ref 8–27)
Bilirubin Total: 0.4 mg/dL (ref 0.0–1.2)
CALCIUM: 9.4 mg/dL (ref 8.7–10.3)
CO2: 23 mmol/L (ref 18–29)
Chloride: 99 mmol/L (ref 97–108)
Creatinine, Ser: 0.82 mg/dL (ref 0.57–1.00)
GFR, EST AFRICAN AMERICAN: 76 mL/min/{1.73_m2} (ref >59)
GFR, EST NON AFRICAN AMERICAN: 66 mL/min/{1.73_m2} (ref >59)
GLOBULIN, TOTAL: 1.7 g/dL (ref 1.5–4.5)
Glucose: 109 mg/dL — ABNORMAL HIGH (ref 65–99)
POTASSIUM: 5.8 mmol/L — AB (ref 3.5–5.2)
SODIUM: 139 mmol/L (ref 134–144)
TOTAL PROTEIN: 5.9 g/dL — AB (ref 6.0–8.5)

## 2015-06-17 LAB — TSH: TSH: 1.94 u[IU]/mL (ref 0.450–4.500)

## 2015-06-17 LAB — HEMOGLOBIN A1C
Est. average glucose Bld gHb Est-mCnc: 137 mg/dL
Hgb A1c MFr Bld: 6.4 % — ABNORMAL HIGH (ref 4.8–5.6)

## 2015-06-20 ENCOUNTER — Telehealth: Payer: Self-pay

## 2015-06-20 DIAGNOSIS — E875 Hyperkalemia: Secondary | ICD-10-CM

## 2015-06-20 NOTE — Telephone Encounter (Signed)
LMTCB 06/20/2015  Thanks,   -Laura  

## 2015-06-20 NOTE — Telephone Encounter (Signed)
-----  Message from Margarita Rana, MD sent at 06/19/2015  1:53 PM EDT ----- Labs stable except for elevated potassium, which may be lab error. Please notify daughter that needs repeat met b  And make sure to write no change per Gordy Clement on lab slip and make sure daughter knows they should not get a bill. Thanks.

## 2015-06-26 DIAGNOSIS — E875 Hyperkalemia: Secondary | ICD-10-CM | POA: Insufficient documentation

## 2015-06-26 NOTE — Telephone Encounter (Signed)
Pt's daughter advised; lab sheet at the front desk.   Thanks,   -Myrth Dahan

## 2015-07-07 ENCOUNTER — Other Ambulatory Visit: Payer: Self-pay

## 2015-07-07 DIAGNOSIS — E7800 Pure hypercholesterolemia, unspecified: Secondary | ICD-10-CM

## 2015-07-07 MED ORDER — PRAVASTATIN SODIUM 10 MG PO TABS: 10.0000 mg | ORAL_TABLET | Freq: Every day | ORAL | Status: AC

## 2015-07-12 LAB — COMPREHENSIVE METABOLIC PANEL
ALT: 9 IU/L (ref 0–32)
AST: 12 IU/L (ref 0–40)
Albumin/Globulin Ratio: 2.6 — ABNORMAL HIGH (ref 1.1–2.5)
Albumin: 4.1 g/dL (ref 3.5–4.7)
Alkaline Phosphatase: 57 IU/L (ref 39–117)
BUN/Creatinine Ratio: 18 (ref 11–26)
BUN: 14 mg/dL (ref 8–27)
Bilirubin Total: 0.3 mg/dL (ref 0.0–1.2)
CALCIUM: 9.1 mg/dL (ref 8.7–10.3)
CO2: 24 mmol/L (ref 18–29)
CREATININE: 0.78 mg/dL (ref 0.57–1.00)
Chloride: 95 mmol/L — ABNORMAL LOW (ref 97–106)
GFR calc Af Amer: 81 mL/min/{1.73_m2} (ref >59)
GFR, EST NON AFRICAN AMERICAN: 70 mL/min/{1.73_m2} (ref >59)
Globulin, Total: 1.6 g/dL (ref 1.5–4.5)
Glucose: 112 mg/dL — ABNORMAL HIGH (ref 65–99)
POTASSIUM: 4.7 mmol/L (ref 3.5–5.2)
Sodium: 134 mmol/L — ABNORMAL LOW (ref 136–144)
Total Protein: 5.7 g/dL — ABNORMAL LOW (ref 6.0–8.5)

## 2015-07-13 ENCOUNTER — Telehealth: Payer: Self-pay

## 2015-07-13 NOTE — Telephone Encounter (Signed)
Stanton KidneyDebra advised.   Thanks,   -Vernona RiegerLaura

## 2015-07-13 NOTE — Telephone Encounter (Signed)
LMTCB 07/13/2015  Thanks,   -Laura  

## 2015-07-13 NOTE — Telephone Encounter (Signed)
Pt daughter returned your call/  CB  [phone].  Thanks Barth Kirkseri

## 2015-07-13 NOTE — Telephone Encounter (Signed)
-----   Message from Lorie PhenixNancy Maloney, MD sent at 07/12/2015  4:42 PM EDT ----- Repeat labs stable. Thanks.

## 2015-08-08 ENCOUNTER — Telehealth: Payer: Self-pay | Admitting: Family Medicine

## 2015-08-08 NOTE — Telephone Encounter (Signed)
Pt's daughter called saying her mother's labs were denied for DOS 05/3015.  Daughter is wanting to know if you can write a letter saying the reason for the labs so they can send in an appeal to insurance.  Her call back is [phone]  Thank sTeri

## 2015-08-08 NOTE — Telephone Encounter (Signed)
Dr. Judie PetitM, how do we go about doing this? Should I forward this message to Maralyn SagoSarah? Please advise. Thanks!

## 2015-08-08 NOTE — Telephone Encounter (Signed)
Can we re-file this? Allene DillonEmily Drozdowski, CMA

## 2015-08-08 NOTE — Telephone Encounter (Signed)
Think we give this to Maralyn SagoSarah. Thanks.

## 2015-08-17 NOTE — Telephone Encounter (Signed)
Pt's daughter returned my call and stated they need a letter from Dr. Elease HashimotoMaloney stating the labs were medically necessary for DOS 06/16/15. When the letter is done the patient will submit the letter to the insurance company and they should cover.  Per the patient we are unable to re-file at this time, the patient/provider has to do an "appeal" to the insurance company which requires the letter.  Please contact the patient's daughter when the letter is ready and they will come and pick up.  Her number is [phone].

## 2015-08-21 NOTE — Telephone Encounter (Signed)
Ok to write letter. Thanks. 

## 2015-09-07 ENCOUNTER — Other Ambulatory Visit: Payer: Self-pay

## 2015-09-07 DIAGNOSIS — F419 Anxiety disorder, unspecified: Secondary | ICD-10-CM

## 2015-09-07 MED ORDER — BUSPIRONE HCL 10 MG PO TABS: ORAL_TABLET | ORAL | Status: DC

## 2015-09-07 MED ORDER — BUSPIRONE HCL 10 MG PO TABS: ORAL_TABLET | ORAL | Status: AC

## 2015-09-07 NOTE — Telephone Encounter (Signed)
Patient daughter is requesting a [facility] Patient will be out of the medication tomorrow. Daughter states [facility] sent a refill request on Monday.

## 2015-09-07 NOTE — Telephone Encounter (Signed)
Printed, please fax or call in to pharmacy. Thank you.   

## 2015-10-17 ENCOUNTER — Telehealth: Payer: Self-pay | Admitting: Family Medicine

## 2015-10-17 NOTE — Telephone Encounter (Signed)
Pt daughter Eunice Blase called stating pt is having leg cramps during the day while sitting and at night while sleeping.  Pt daughter is asking is this is due to her Rx.  If it from the Rx can a change be made with Rx to help with this.  [facility].  CB#[phone]/MW

## 2015-10-17 NOTE — Telephone Encounter (Signed)
Could be related to Pravastatin. Would stop medication. Thanks.

## 2015-10-17 NOTE — Telephone Encounter (Signed)
Advised patient's daughter as below.  

## 2015-10-25 ENCOUNTER — Telehealth: Payer: Self-pay | Admitting: Family Medicine

## 2015-10-25 DIAGNOSIS — M62838 Other muscle spasm: Secondary | ICD-10-CM

## 2015-10-25 NOTE — Telephone Encounter (Signed)
Can try a medication called Baclofen and see if helps. Clarify when cramps are worst?  Thanks.

## 2015-10-25 NOTE — Telephone Encounter (Signed)
Pt's daughter called saying mom is still having leg cramps mostly at night.  She wants to know if there is anything else she can do.  She is off the pravastatin.  Daughters call back [phone]  Thanks Barth Kirks

## 2015-10-25 NOTE — Telephone Encounter (Signed)
Patient daughter advised. Daughter states patient reports cramps are worse at night. Pharmacy: [facility]   Daughter wanted to know if patient would benefit from checking Vitamin D levels.

## 2015-10-26 MED ORDER — BACLOFEN 10 MG PO TABS: 10.0000 mg | ORAL_TABLET | Freq: Every evening | ORAL | Status: DC

## 2015-10-26 NOTE — Telephone Encounter (Signed)
Sent rx for nightly med. Difficult to treat but will see if this helps. Thanks.

## 2015-10-31 ENCOUNTER — Other Ambulatory Visit: Payer: Self-pay

## 2015-10-31 DIAGNOSIS — I10 Essential (primary) hypertension: Secondary | ICD-10-CM

## 2015-10-31 MED ORDER — LISINOPRIL 10 MG PO TABS: 10.0000 mg | ORAL_TABLET | Freq: Every day | ORAL | Status: AC

## 2015-11-17 ENCOUNTER — Other Ambulatory Visit: Payer: Self-pay | Admitting: Family Medicine

## 2015-11-17 DIAGNOSIS — K219 Gastro-esophageal reflux disease without esophagitis: Secondary | ICD-10-CM

## 2015-12-07 ENCOUNTER — Other Ambulatory Visit: Payer: Self-pay | Admitting: Family Medicine

## 2015-12-14 ENCOUNTER — Ambulatory Visit: Payer: Commercial Managed Care - HMO | Admitting: Family Medicine

## 2015-12-16 ENCOUNTER — Encounter: Payer: Self-pay | Admitting: Emergency Medicine

## 2015-12-16 ENCOUNTER — Emergency Department: Payer: Commercial Managed Care - HMO

## 2015-12-16 ENCOUNTER — Inpatient Hospital Stay
Admission: EM | Admit: 2015-12-16 | Discharge: 2015-12-18 | DRG: 690 | Disposition: A | Discharge status: Skilled Nursing Facility | Payer: Commercial Managed Care - HMO | Attending: Internal Medicine | Admitting: Internal Medicine

## 2015-12-16 DIAGNOSIS — R5381 Other malaise: Secondary | ICD-10-CM | POA: Diagnosis present

## 2015-12-16 DIAGNOSIS — N390 Urinary tract infection, site not specified: Principal | ICD-10-CM | POA: Diagnosis present

## 2015-12-16 DIAGNOSIS — R300 Dysuria: Secondary | ICD-10-CM | POA: Diagnosis present

## 2015-12-16 DIAGNOSIS — R8281 Pyuria: Secondary | ICD-10-CM | POA: Diagnosis present

## 2015-12-16 DIAGNOSIS — E871 Hypo-osmolality and hyponatremia: Secondary | ICD-10-CM | POA: Diagnosis not present

## 2015-12-16 DIAGNOSIS — E785 Hyperlipidemia, unspecified: Secondary | ICD-10-CM | POA: Diagnosis present

## 2015-12-16 DIAGNOSIS — Z9842 Cataract extraction status, left eye: Secondary | ICD-10-CM

## 2015-12-16 DIAGNOSIS — Z9841 Cataract extraction status, right eye: Secondary | ICD-10-CM

## 2015-12-16 DIAGNOSIS — S022XXA Fracture of nasal bones, initial encounter for closed fracture: Secondary | ICD-10-CM | POA: Diagnosis present

## 2015-12-16 DIAGNOSIS — H409 Unspecified glaucoma: Secondary | ICD-10-CM | POA: Diagnosis present

## 2015-12-16 DIAGNOSIS — E039 Hypothyroidism, unspecified: Secondary | ICD-10-CM | POA: Diagnosis present

## 2015-12-16 DIAGNOSIS — Z66 Do not resuscitate: Secondary | ICD-10-CM | POA: Diagnosis present

## 2015-12-16 DIAGNOSIS — S0083XA Contusion of other part of head, initial encounter: Secondary | ICD-10-CM

## 2015-12-16 DIAGNOSIS — M25551 Pain in right hip: Secondary | ICD-10-CM

## 2015-12-16 DIAGNOSIS — Z9851 Tubal ligation status: Secondary | ICD-10-CM

## 2015-12-16 DIAGNOSIS — Y92009 Unspecified place in unspecified non-institutional (private) residence as the place of occurrence of the external cause: Secondary | ICD-10-CM

## 2015-12-16 DIAGNOSIS — W19XXXA Unspecified fall, initial encounter: Secondary | ICD-10-CM | POA: Diagnosis present

## 2015-12-16 DIAGNOSIS — Z825 Family history of asthma and other chronic lower respiratory diseases: Secondary | ICD-10-CM

## 2015-12-16 DIAGNOSIS — E860 Dehydration: Secondary | ICD-10-CM | POA: Diagnosis present

## 2015-12-16 DIAGNOSIS — Z7982 Long term (current) use of aspirin: Secondary | ICD-10-CM

## 2015-12-16 DIAGNOSIS — Z886 Allergy status to analgesic agent status: Secondary | ICD-10-CM

## 2015-12-16 DIAGNOSIS — K210 Gastro-esophageal reflux disease with esophagitis: Secondary | ICD-10-CM | POA: Diagnosis present

## 2015-12-16 DIAGNOSIS — Z888 Allergy status to other drugs, medicaments and biological substances status: Secondary | ICD-10-CM

## 2015-12-16 DIAGNOSIS — S0181XA Laceration without foreign body of other part of head, initial encounter: Secondary | ICD-10-CM | POA: Diagnosis present

## 2015-12-16 DIAGNOSIS — Z8249 Family history of ischemic heart disease and other diseases of the circulatory system: Secondary | ICD-10-CM

## 2015-12-16 DIAGNOSIS — I10 Essential (primary) hypertension: Secondary | ICD-10-CM | POA: Diagnosis present

## 2015-12-16 DIAGNOSIS — Z79899 Other long term (current) drug therapy: Secondary | ICD-10-CM

## 2015-12-16 DIAGNOSIS — F419 Anxiety disorder, unspecified: Secondary | ICD-10-CM | POA: Diagnosis present

## 2015-12-16 HISTORY — DX: Anxiety disorder, unspecified: F41.9

## 2015-12-16 HISTORY — DX: Disorder of thyroid, unspecified: E07.9

## 2015-12-16 HISTORY — DX: Unspecified glaucoma: H40.9

## 2015-12-16 HISTORY — DX: Drug induced subacute dyskinesia: G24.01

## 2015-12-16 NOTE — ED Notes (Signed)
Per EMS patient presses "LIfeLock" alert which contacted 911.  EMS found her on the fllor halfway under her bed in blood covering an area they approximated at 350-44200mL of blood.  Patient is AOx4 and will answer questions, but she is presenting very quiet.  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

## 2015-12-16 NOTE — ED Notes (Signed)
Patient to CT at this time. Family at bedside and have been updated on POC.

## 2015-12-17 ENCOUNTER — Encounter: Payer: Self-pay | Admitting: Internal Medicine

## 2015-12-17 DIAGNOSIS — Z886 Allergy status to analgesic agent status: Secondary | ICD-10-CM | POA: Diagnosis not present

## 2015-12-17 DIAGNOSIS — Z66 Do not resuscitate: Secondary | ICD-10-CM | POA: Diagnosis present

## 2015-12-17 DIAGNOSIS — S0181XA Laceration without foreign body of other part of head, initial encounter: Secondary | ICD-10-CM | POA: Diagnosis present

## 2015-12-17 DIAGNOSIS — N390 Urinary tract infection, site not specified: Secondary | ICD-10-CM | POA: Diagnosis present

## 2015-12-17 DIAGNOSIS — R8281 Pyuria: Secondary | ICD-10-CM | POA: Diagnosis present

## 2015-12-17 DIAGNOSIS — Y92009 Unspecified place in unspecified non-institutional (private) residence as the place of occurrence of the external cause: Secondary | ICD-10-CM | POA: Diagnosis not present

## 2015-12-17 DIAGNOSIS — Z825 Family history of asthma and other chronic lower respiratory diseases: Secondary | ICD-10-CM | POA: Diagnosis not present

## 2015-12-17 DIAGNOSIS — S022XXA Fracture of nasal bones, initial encounter for closed fracture: Secondary | ICD-10-CM | POA: Diagnosis present

## 2015-12-17 DIAGNOSIS — W19XXXA Unspecified fall, initial encounter: Secondary | ICD-10-CM | POA: Diagnosis present

## 2015-12-17 DIAGNOSIS — Z9842 Cataract extraction status, left eye: Secondary | ICD-10-CM | POA: Diagnosis not present

## 2015-12-17 DIAGNOSIS — Z79899 Other long term (current) drug therapy: Secondary | ICD-10-CM | POA: Diagnosis not present

## 2015-12-17 DIAGNOSIS — Z8249 Family history of ischemic heart disease and other diseases of the circulatory system: Secondary | ICD-10-CM | POA: Diagnosis not present

## 2015-12-17 DIAGNOSIS — Z7982 Long term (current) use of aspirin: Secondary | ICD-10-CM | POA: Diagnosis not present

## 2015-12-17 DIAGNOSIS — E871 Hypo-osmolality and hyponatremia: Secondary | ICD-10-CM | POA: Diagnosis present

## 2015-12-17 DIAGNOSIS — R300 Dysuria: Secondary | ICD-10-CM | POA: Diagnosis present

## 2015-12-17 DIAGNOSIS — Z9851 Tubal ligation status: Secondary | ICD-10-CM | POA: Diagnosis not present

## 2015-12-17 DIAGNOSIS — K210 Gastro-esophageal reflux disease with esophagitis: Secondary | ICD-10-CM | POA: Diagnosis present

## 2015-12-17 DIAGNOSIS — E039 Hypothyroidism, unspecified: Secondary | ICD-10-CM | POA: Diagnosis present

## 2015-12-17 DIAGNOSIS — E860 Dehydration: Secondary | ICD-10-CM | POA: Diagnosis present

## 2015-12-17 DIAGNOSIS — Z888 Allergy status to other drugs, medicaments and biological substances status: Secondary | ICD-10-CM | POA: Diagnosis not present

## 2015-12-17 DIAGNOSIS — H409 Unspecified glaucoma: Secondary | ICD-10-CM | POA: Diagnosis present

## 2015-12-17 DIAGNOSIS — E785 Hyperlipidemia, unspecified: Secondary | ICD-10-CM | POA: Diagnosis present

## 2015-12-17 DIAGNOSIS — F419 Anxiety disorder, unspecified: Secondary | ICD-10-CM | POA: Diagnosis present

## 2015-12-17 DIAGNOSIS — I10 Essential (primary) hypertension: Secondary | ICD-10-CM | POA: Diagnosis present

## 2015-12-17 DIAGNOSIS — R5381 Other malaise: Secondary | ICD-10-CM | POA: Diagnosis present

## 2015-12-17 DIAGNOSIS — Z9841 Cataract extraction status, right eye: Secondary | ICD-10-CM | POA: Diagnosis not present

## 2015-12-17 LAB — COMPREHENSIVE METABOLIC PANEL
ALT: 9 U/L — AB (ref 14–54)
ANION GAP: 5 (ref 5–15)
AST: 18 U/L (ref 15–41)
Albumin: 3.2 g/dL — ABNORMAL LOW (ref 3.5–5.0)
Alkaline Phosphatase: 52 U/L (ref 38–126)
BUN: 13 mg/dL (ref 6–20)
CHLORIDE: 104 mmol/L (ref 101–111)
CO2: 23 mmol/L (ref 22–32)
CREATININE: 0.71 mg/dL (ref 0.44–1.00)
Calcium: 8.4 mg/dL — ABNORMAL LOW (ref 8.9–10.3)
GFR calc non Af Amer: >60 mL/min (ref >60)
Glucose, Bld: 113 mg/dL — ABNORMAL HIGH (ref 65–99)
Potassium: 4.2 mmol/L (ref 3.5–5.1)
SODIUM: 132 mmol/L — AB (ref 135–145)
Total Bilirubin: 0.5 mg/dL (ref 0.3–1.2)
Total Protein: 5.6 g/dL — ABNORMAL LOW (ref 6.5–8.1)

## 2015-12-17 LAB — URINALYSIS COMPLETE WITH MICROSCOPIC (ARMC ONLY)
Bilirubin Urine: NEGATIVE
Glucose, UA: NEGATIVE mg/dL
Nitrite: NEGATIVE
PH: 5 (ref 5.0–8.0)
PROTEIN: 30 mg/dL — AB
SQUAMOUS EPITHELIAL / LPF: NONE SEEN
Specific Gravity, Urine: 1.01 (ref 1.005–1.030)

## 2015-12-17 LAB — CBC
HCT: 33.5 % — ABNORMAL LOW (ref 35.0–47.0)
HEMOGLOBIN: 11.2 g/dL — AB (ref 12.0–16.0)
MCH: 28.8 pg (ref 26.0–34.0)
MCHC: 33.6 g/dL (ref 32.0–36.0)
MCV: 85.7 fL (ref 80.0–100.0)
PLATELETS: 229 10*3/uL (ref 150–440)
RBC: 3.91 MIL/uL (ref 3.80–5.20)
RDW: 12.9 % (ref 11.5–14.5)
WBC: 9.5 10*3/uL (ref 3.6–11.0)

## 2015-12-17 LAB — BASIC METABOLIC PANEL
Anion gap: 10 (ref 5–15)
BUN: 18 mg/dL (ref 6–20)
CHLORIDE: 93 mmol/L — AB (ref 101–111)
CO2: 22 mmol/L (ref 22–32)
CREATININE: 0.74 mg/dL (ref 0.44–1.00)
Calcium: 8.8 mg/dL — ABNORMAL LOW (ref 8.9–10.3)
GFR calc Af Amer: >60 mL/min (ref >60)
GFR calc non Af Amer: >60 mL/min (ref >60)
Glucose, Bld: 102 mg/dL — ABNORMAL HIGH (ref 65–99)
Potassium: 4.2 mmol/L (ref 3.5–5.1)
Sodium: 125 mmol/L — ABNORMAL LOW (ref 135–145)

## 2015-12-17 LAB — TSH: TSH: 4.124 u[IU]/mL (ref 0.350–4.500)

## 2015-12-17 LAB — TROPONIN I: Troponin I: <0.03 ng/mL (ref <0.031)

## 2015-12-17 MED ORDER — BACLOFEN 10 MG PO TABS
5.0000 mg | ORAL_TABLET | Freq: Every evening | ORAL | Status: DC
  Administered 2015-12-17: 5 mg via ORAL
  Filled 2015-12-17: qty 1

## 2015-12-17 MED ORDER — LIDOCAINE-EPINEPHRINE-TETRACAINE (LET) SOLUTION
NASAL | Status: AC
  Administered 2015-12-17: 3 mL via TOPICAL
  Filled 2015-12-17: qty 6

## 2015-12-17 MED ORDER — SODIUM CHLORIDE 0.9 % IV BOLUS (SEPSIS)
1000.0000 mL | Freq: Once | INTRAVENOUS | Status: AC
  Administered 2015-12-17: 1000 mL via INTRAVENOUS

## 2015-12-17 MED ORDER — LIDOCAINE-EPINEPHRINE-TETRACAINE (LET) SOLUTION
3.0000 mL | Freq: Once | NASAL | Status: AC
  Administered 2015-12-17: 3 mL via TOPICAL
  Filled 2015-12-17: qty 3

## 2015-12-17 MED ORDER — ACETAMINOPHEN 500 MG PO TABS
1000.0000 mg | ORAL_TABLET | Freq: Four times a day (QID) | ORAL | Status: DC | PRN
  Administered 2015-12-17 (×2): 1000 mg via ORAL
  Filled 2015-12-17 (×4): qty 2

## 2015-12-17 MED ORDER — BUSPIRONE HCL 10 MG PO TABS
10.0000 mg | ORAL_TABLET | Freq: Two times a day (BID) | ORAL | Status: DC
  Administered 2015-12-17 – 2015-12-18 (×3): 10 mg via ORAL
  Filled 2015-12-17 (×4): qty 1

## 2015-12-17 MED ORDER — MUSCLE RUB 10-15 % EX CREA
TOPICAL_CREAM | CUTANEOUS | Status: DC | PRN
  Filled 2015-12-17: qty 85

## 2015-12-17 MED ORDER — ASPIRIN EC 81 MG PO TBEC
81.0000 mg | DELAYED_RELEASE_TABLET | Freq: Every day | ORAL | Status: DC
  Administered 2015-12-17 – 2015-12-18 (×2): 81 mg via ORAL
  Filled 2015-12-17 (×2): qty 1

## 2015-12-17 MED ORDER — PANTOPRAZOLE SODIUM 40 MG PO TBEC
40.0000 mg | DELAYED_RELEASE_TABLET | Freq: Every day | ORAL | Status: DC
  Administered 2015-12-17 – 2015-12-18 (×2): 40 mg via ORAL
  Filled 2015-12-17 (×2): qty 1

## 2015-12-17 MED ORDER — LEVOTHYROXINE SODIUM 50 MCG PO TABS
50.0000 ug | ORAL_TABLET | Freq: Every day | ORAL | Status: DC
  Administered 2015-12-17: 50 ug via ORAL
  Filled 2015-12-17: qty 1

## 2015-12-17 MED ORDER — LIDOCAINE-EPINEPHRINE 1 %-1:200000 IJ SOLN
INTRAMUSCULAR | Status: AC
Start: 2015-12-17 — End: 2015-12-17
  Administered 2015-12-17: 30 mL
  Filled 2015-12-17: qty 30

## 2015-12-17 MED ORDER — DEXTROSE 5 % IV SOLN
1.0000 g | INTRAVENOUS | Status: DC
Start: 2015-12-17 — End: 2015-12-18
  Administered 2015-12-18: 1 g via INTRAVENOUS
  Filled 2015-12-17 (×2): qty 10

## 2015-12-17 MED ORDER — FELODIPINE ER 2.5 MG PO TB24
5.0000 mg | ORAL_TABLET | Freq: Every day | ORAL | Status: DC
  Administered 2015-12-17 – 2015-12-18 (×2): 5 mg via ORAL
  Filled 2015-12-17 (×2): qty 2

## 2015-12-17 MED ORDER — SODIUM CHLORIDE 0.9 % IV SOLN
INTRAVENOUS | Status: AC
  Administered 2015-12-17 (×2): via INTRAVENOUS

## 2015-12-17 MED ORDER — TIMOLOL MALEATE 0.5 % OP SOLN
1.0000 [drp] | Freq: Two times a day (BID) | OPHTHALMIC | Status: DC
  Administered 2015-12-17 (×2): 1 [drp] via OPHTHALMIC
  Filled 2015-12-17: qty 5

## 2015-12-17 MED ORDER — PRAVASTATIN SODIUM 20 MG PO TABS
10.0000 mg | ORAL_TABLET | Freq: Every day | ORAL | Status: DC
  Administered 2015-12-17 – 2015-12-18 (×2): 10 mg via ORAL
  Filled 2015-12-17 (×2): qty 1

## 2015-12-17 MED ORDER — SODIUM CHLORIDE 0.9 % IV BOLUS (SEPSIS)
500.0000 mL | Freq: Once | INTRAVENOUS | Status: AC
  Administered 2015-12-17: 500 mL via INTRAVENOUS

## 2015-12-17 MED ORDER — MORPHINE SULFATE (PF) 2 MG/ML IV SOLN: 1.0000 mg | INTRAVENOUS | Status: DC | PRN

## 2015-12-17 MED ORDER — DEXTROSE 5 % IV SOLN
1.0000 g | Freq: Once | INTRAVENOUS | Status: AC
  Administered 2015-12-17: 1 g via INTRAVENOUS
  Filled 2015-12-17: qty 10

## 2015-12-17 MED ORDER — HEPARIN SODIUM (PORCINE) 5000 UNIT/ML IJ SOLN
5000.0000 [IU] | Freq: Three times a day (TID) | INTRAMUSCULAR | Status: DC
  Administered 2015-12-17 – 2015-12-18 (×4): 5000 [IU] via SUBCUTANEOUS
  Filled 2015-12-17 (×4): qty 1

## 2015-12-17 MED ORDER — MIRTAZAPINE 15 MG PO TABS
15.0000 mg | ORAL_TABLET | Freq: Every day | ORAL | Status: DC
  Administered 2015-12-17: 15 mg via ORAL
  Filled 2015-12-17: qty 1

## 2015-12-17 MED ORDER — LISINOPRIL 10 MG PO TABS
10.0000 mg | ORAL_TABLET | Freq: Every day | ORAL | Status: DC
  Administered 2015-12-17 – 2015-12-18 (×2): 10 mg via ORAL
  Filled 2015-12-17 (×2): qty 1

## 2015-12-17 NOTE — H&P (Signed)
[facility] - [place] at [facility]   PATIENT NAME: Katrina Guerrero    MR#:  [id number]  DATE OF BIRTH:  May 27, 1929  DATE OF ADMISSION:  12/16/2015  PRIMARY CARE PHYSICIAN: Lorie Phenix, MD   REQUESTING/REFERRING PHYSICIAN: Dr. Zenda Alpers  CHIEF COMPLAINT:   Chief Complaint  Patient presents with  . Fall    HISTORY OF PRESENT ILLNESS: Katrina Guerrero  is a 82 y.o. female with a known history of Hypertension, GERD, hyperlipidemia, hypothyroidism, anxiety, glaucoma antibiotics initiated. She presents to the ED today via EMS after she had a fall at home. Patient lives alone but close to her family. Patient said she went to bed tonight and woke up on the floor. It's unclear how she fell. She however thinks that she got up to go to the bathroom and fell but she is unsure. When she found herself on the floor she had a laceration near her right eye and she was bleeding on the floor. She was able to call EMS through life alert. EMS found her on the floor bleeding from her wound. She currently denies any pain but states that she has been generally weak for many months. She lives alone and is not open to the idea of living in a nursing facility. She denies any fever, recent travel, new medications or changing old medications, use of blood thinners or over-the-counter medications. She also denies any sick contacts, cough, shortness of breath, chest pain, nausea, vomiting, diarrhea, bleeding from any orifice or change in appetite. She however endorses some occasional dysuria. Chart review reveals that patient has a history of tardive dyskinesia but there is no documentation of dementia.   On arrival in the ED, vital signs are stable and patient was noted to be bleeding from a laceration near her right eye. Laceration was prompted sutured with 9 stitches. Patient received 1 L of normal saline in the ED. CBC showed a hemoglobin was 11.2. CMP was significant for a sodium of 125. Urinalysis  showed pyuria with rare bacteria. Troponin was negative and EKG showed normal sinus rhythm. CT of the head, cervical spine and maxilla showed no acute disorder in the brain nor fracture or dislocation of the cervical spine. However, patient had soft tissue swelling as well as laceration on the right side of her face. Urine cultures have been obtained and patient was given ceftriaxone examined in the ED due to suspicion of UTI. Patient will be admitted for observation due to fall, physical deconditioning, hyponatremia and facial laceration. She is DO NOT INTUBATE DO NOT RESUSCITATE per son and daughter at bedside.  PAST MEDICAL HISTORY:   Past Medical History  Diagnosis Date  . Hypertension   . Hyperlipidemia   . GERD (gastroesophageal reflux disease)   . Thyroid disease   . Anxiety   . Glaucoma   . Dyskinesia, tardive 02/23/2015    Mild (tongue).  Possibly related to previous use of Reglan.     PAST SURGICAL HISTORY: Past Surgical History  Procedure Laterality Date  . Tubal ligation    . Cataract extraction Bilateral     SOCIAL HISTORY:  Social History  Substance Use Topics  . Smoking status: Never Smoker   . Smokeless tobacco: Never Used  . Alcohol Use: No    FAMILY HISTORY:  Family History  Problem Relation Age of Onset  . Hypertension Mother   . Heart attack Father   . Emphysema Brother     DRUG ALLERGIES:  Allergies  Allergen Reactions  .  Codeine   . Furosemide     lowers blood pressure  . Levofloxacin     Other reaction(s): Dizziness Weakness    REVIEW OF SYSTEMS:   CONSTITUTIONAL: No fever. + fatigue/weakness.  EYES: No blurred or double vision.  EARS, NOSE, AND THROAT: No tinnitus or ear pain.  RESPIRATORY: No cough, shortness of breath, wheezing or hemoptysis.  CARDIOVASCULAR: No chest pain, orthopnea, edema.  GASTROINTESTINAL: No nausea, vomiting, diarrhea or abdominal pain.  GENITOURINARY: No dysuria, hematuria.  ENDOCRINE: No polyuria, nocturia,   HEMATOLOGY: No anemia SKIN: No rash or lesion. MUSCULOSKELETAL: No joint pain or arthritis.   NEUROLOGIC: No tingling, numbness, weakness.  PSYCHIATRY: No anxiety or depression.   MEDICATIONS AT HOME:  Prior to Admission medications   Medication Sig Start Date End Date Taking? Authorizing Provider  acetaminophen (TYLENOL) 500 MG tablet Take 1,000 mg by mouth every 6 (six) hours as needed.   Yes Historical Provider, MD  aspirin 81 MG tablet Take 81 mg by mouth daily.    Yes Historical Provider, MD  baclofen (LIORESAL) 10 MG tablet TAKE ONE TABLET BY MOUTH EACH EVENING AS DIRECTED Patient taking differently: take 0.5 tablet qpm 12/07/15  Yes Lorie Phenix, MD  busPIRone (BUSPAR) 10 MG tablet 1 tablet mid-day and then 2 at night. 09/07/15  Yes Lorie Phenix, MD  felodipine (PLENDIL) 5 MG 24 hr tablet Take 5 mg by mouth daily.    Yes Historical Provider, MD  levothyroxine (LEVOXYL) 50 MCG tablet Take 1 tablet (50 mcg total) by mouth daily before breakfast. 03/02/15  Yes Lorie Phenix, MD  lisinopril (PRINIVIL,ZESTRIL) 10 MG tablet Take 1 tablet (10 mg total) by mouth daily. 10/31/15  Yes Lorie Phenix, MD  Menthol-Methyl Salicylate (MUSCLE RUB EX)  04/13/15  Yes Historical Provider, MD  mirtazapine (REMERON) 15 MG tablet Take 1 tablet (15 mg total) by mouth at bedtime. 06/16/15  Yes Lorie Phenix, MD  Multiple Vitamin (MULTI-VITAMINS) TABS Take 1 tablet by mouth daily.   Yes Historical Provider, MD  nystatin (MYCOSTATIN/NYSTOP) 100000 UNIT/GM POWD  12/10/13  Yes Historical Provider, MD  omeprazole (PRILOSEC) 20 MG capsule TAKE ONE (1) CAPSULE BY MOUTH 2 TIMES DAILY 11/17/15  Yes Lorie Phenix, MD  pravastatin (PRAVACHOL) 10 MG tablet Take 1 tablet (10 mg total) by mouth daily. 07/07/15  Yes Lorie Phenix, MD  timolol (TIMOPTIC) 0.5 % ophthalmic solution Apply to eye. 02/27/11  Yes Historical Provider, MD      PHYSICAL EXAMINATION:   VITAL SIGNS: Blood pressure 136/75, pulse 86, temperature 98.1 F  (36.7 C), temperature source Oral, resp. rate 19, height  (1.6 m), weight 63.504 kg (140 lb), SpO2 99 %.  GENERAL:  82 y.o.-year-old patient lying in the bed with no acute distress. Alert and oriented x 3. EYES: Pupils equal, round, reactive to light and accommodation. No scleral icterus. Extraocular muscles intact.  HEENT: Patient has ecchymotic swelling around the right eye with a 1.5 inch laceration that has just been sutured. There is no active bleeding at this time and patient denies any tenderness to palpation.  NECK:  Supple, no jugular venous distention. No thyroid enlargement, no tenderness.  LUNGS: Normal breath sounds bilaterally, no wheezing, rales,rhonchi or crepitation. No use of accessory muscles of respiration.  CARDIOVASCULAR: S1, S2 normal. No murmurs, rubs, or gallops.  ABDOMEN: Soft, nontender, nondistended. Bowel sounds present. No organomegaly or mass.  EXTREMITIES: No pedal edema, cyanosis, or clubbing.  NEUROLOGIC: Cranial nerves II through XII are intact. Muscle strength 5/5 in  all extremities. Sensation intact. Gait not checked.   SKIN: No obvious rash, lesion, or ulcer.   LABORATORY PANEL:   CBC  Recent Labs Lab 12/17/15 0031  WBC 9.5  HGB 11.2*  HCT 33.5*  PLT 229  MCV 85.7  MCH 28.8  MCHC 33.6  RDW 12.9   ------------------------------------------------------------------------------------------------------------------  Chemistries   Recent Labs Lab 12/17/15 0031  NA 125*  K 4.2  CL 93*  CO2 22  GLUCOSE 102*  BUN 18  CREATININE 0.74  CALCIUM 8.8*   ------------------------------------------------------------------------------------------------------------------ estimated creatinine clearance is 46.9 mL/min (by C-G formula based on Cr of 0.74). ------------------------------------------------------------------------------------------------------------------ No results for input(s): TSH, T4TOTAL, T3FREE, THYROIDAB in the last 72  hours.  Invalid input(s): FREET3   Coagulation profile No results for input(s): INR, PROTIME in the last 168 hours. ------------------------------------------------------------------------------------------------------------------- No results for input(s): DDIMER in the last 72 hours. -------------------------------------------------------------------------------------------------------------------  Cardiac Enzymes  Recent Labs Lab 12/17/15 0031  TROPONINI <0.03   ------------------------------------------------------------------------------------------------------------------ Invalid input(s): POCBNP  ---------------------------------------------------------------------------------------------------------------  Urinalysis    Component Value Date/Time   COLORURINE YELLOW* 12/17/2015 0130   APPEARANCEUR TURBID* 12/17/2015 0130   LABSPEC 1.010 12/17/2015 0130   PHURINE 5.0 12/17/2015 0130   GLUCOSEU NEGATIVE 12/17/2015 0130   HGBUR 1+* 12/17/2015 0130   BILIRUBINUR NEGATIVE 12/17/2015 0130   KETONESUR 1+* 12/17/2015 0130   PROTEINUR 30* 12/17/2015 0130   NITRITE NEGATIVE 12/17/2015 0130   LEUKOCYTESUR 3+* 12/17/2015 0130     RADIOLOGY: Ct Head Wo Contrast  12/17/2015  CLINICAL DATA:  Unwitnessed fall tonight. Large RIGHT eye hematoma. Occipital hematoma. History of hypertension and hyperlipidemia. EXAM: CT HEAD WITHOUT CONTRAST CT MAXILLOFACIAL WITHOUT CONTRAST CT CERVICAL SPINE WITHOUT CONTRAST TECHNIQUE: Multidetector CT imaging of the head, cervical spine, and maxillofacial structures were performed using the standard protocol without intravenous contrast. Multiplanar CT image reconstructions of the cervical spine and maxillofacial structures were also generated. COMPARISON:  CT head December 11, 2010 FINDINGS: CT HEAD FINDINGS INTRACRANIAL CONTENTS: The ventricles and sulci are normal for age. No intraparenchymal hemorrhage, mass effect nor midline shift. Patchy  supratentorial white matter hypodensities are within normal range for patient's age and though non-specific likely represent chronic small vessel ischemic disease. Old bilateral basal ganglia lacunar infarcts. No acute large vascular territory infarcts. No abnormal extra-axial fluid collections. Basal cisterns are patent. Moderate to severe calcific atherosclerosis of the carotid siphons. SKULL/SOFT TISSUES: No skull fracture. No significant soft tissue swelling. CT MAXILLOFACIAL FINDINGS FACIAL BONES/SOFT TISSUES: Acute nondisplaced distal bilateral nasal bone fractures. Mandible is intact and the condyles are located. Patient is edentulous. Moderate RIGHT temporomandibular osteoarthrosis. No destructive bony lesions. RIGHT periorbital and RIGHT pre malar soft tissue swelling and subcutaneous fat stranding without subcutaneous gas or radiopaque foreign bodies. RIGHT periorbital soft tissue laceration. SINUSES: Paranasal sinuses are well aerated. Nasal septum is midline. ORBITS: Ocular globes and orbital contents are nonsuspicious, status post bilateral ocular lens implants. CT CERVICAL SPINE FINDINGS Cervical vertebral bodies and posterior elements intact and aligned with maintenance of the cervical lordosis. Moderate C3-4 thru C5-6 disc height loss, mild at C6-7 with uncovertebral hypertrophy and endplate spurring. C1-2 articulation maintained with moderate arthropathy, calcified apical ligament. No destructive bony lesions. Moderate to severe C3-4, C4-5, C5-6 and C6-7 neural foraminal narrowing. Subcentimeter RIGHT thyroid nodule is below size followup recommendations. No prevertebral soft tissue swelling. IMPRESSION: CT HEAD: No acute intracranial process. Stable appearance of the head including mild chronic small vessel ischemic disease and old bilateral basal ganglia lacunar infarcts. CT MAXILLOFACIAL: Acute bilateral  nondisplaced nasal bone fractures. RIGHT facial soft tissue swelling, and RIGHT periorbital  soft tissue laceration. No postseptal hematoma. CT CERVICAL SPINE: No acute fracture or malalignment. Electronically Signed   By: Awilda Metroourtnay  Bloomer M.D.   On: 12/17/2015 01:09   Ct Cervical Spine Wo Contrast  12/17/2015  CLINICAL DATA:  Unwitnessed fall tonight. Large RIGHT eye hematoma. Occipital hematoma. History of hypertension and hyperlipidemia. EXAM: CT HEAD WITHOUT CONTRAST CT MAXILLOFACIAL WITHOUT CONTRAST CT CERVICAL SPINE WITHOUT CONTRAST TECHNIQUE: Multidetector CT imaging of the head, cervical spine, and maxillofacial structures were performed using the standard protocol without intravenous contrast. Multiplanar CT image reconstructions of the cervical spine and maxillofacial structures were also generated. COMPARISON:  CT head December 11, 2010 FINDINGS: CT HEAD FINDINGS INTRACRANIAL CONTENTS: The ventricles and sulci are normal for age. No intraparenchymal hemorrhage, mass effect nor midline shift. Patchy supratentorial white matter hypodensities are within normal range for patient's age and though non-specific likely represent chronic small vessel ischemic disease. Old bilateral basal ganglia lacunar infarcts. No acute large vascular territory infarcts. No abnormal extra-axial fluid collections. Basal cisterns are patent. Moderate to severe calcific atherosclerosis of the carotid siphons. SKULL/SOFT TISSUES: No skull fracture. No significant soft tissue swelling. CT MAXILLOFACIAL FINDINGS FACIAL BONES/SOFT TISSUES: Acute nondisplaced distal bilateral nasal bone fractures. Mandible is intact and the condyles are located. Patient is edentulous. Moderate RIGHT temporomandibular osteoarthrosis. No destructive bony lesions. RIGHT periorbital and RIGHT pre malar soft tissue swelling and subcutaneous fat stranding without subcutaneous gas or radiopaque foreign bodies. RIGHT periorbital soft tissue laceration. SINUSES: Paranasal sinuses are well aerated. Nasal septum is midline. ORBITS: Ocular globes and  orbital contents are nonsuspicious, status post bilateral ocular lens implants. CT CERVICAL SPINE FINDINGS Cervical vertebral bodies and posterior elements intact and aligned with maintenance of the cervical lordosis. Moderate C3-4 thru C5-6 disc height loss, mild at C6-7 with uncovertebral hypertrophy and endplate spurring. C1-2 articulation maintained with moderate arthropathy, calcified apical ligament. No destructive bony lesions. Moderate to severe C3-4, C4-5, C5-6 and C6-7 neural foraminal narrowing. Subcentimeter RIGHT thyroid nodule is below size followup recommendations. No prevertebral soft tissue swelling. IMPRESSION: CT HEAD: No acute intracranial process. Stable appearance of the head including mild chronic small vessel ischemic disease and old bilateral basal ganglia lacunar infarcts. CT MAXILLOFACIAL: Acute bilateral nondisplaced nasal bone fractures. RIGHT facial soft tissue swelling, and RIGHT periorbital soft tissue laceration. No postseptal hematoma. CT CERVICAL SPINE: No acute fracture or malalignment. Electronically Signed   By: Awilda Metroourtnay  Bloomer M.D.   On: 12/17/2015 01:09   Ct Maxillofacial Wo Cm  12/17/2015  CLINICAL DATA:  Unwitnessed fall tonight. Large RIGHT eye hematoma. Occipital hematoma. History of hypertension and hyperlipidemia. EXAM: CT HEAD WITHOUT CONTRAST CT MAXILLOFACIAL WITHOUT CONTRAST CT CERVICAL SPINE WITHOUT CONTRAST TECHNIQUE: Multidetector CT imaging of the head, cervical spine, and maxillofacial structures were performed using the standard protocol without intravenous contrast. Multiplanar CT image reconstructions of the cervical spine and maxillofacial structures were also generated. COMPARISON:  CT head December 11, 2010 FINDINGS: CT HEAD FINDINGS INTRACRANIAL CONTENTS: The ventricles and sulci are normal for age. No intraparenchymal hemorrhage, mass effect nor midline shift. Patchy supratentorial white matter hypodensities are within normal range for patient's age and  though non-specific likely represent chronic small vessel ischemic disease. Old bilateral basal ganglia lacunar infarcts. No acute large vascular territory infarcts. No abnormal extra-axial fluid collections. Basal cisterns are patent. Moderate to severe calcific atherosclerosis of the carotid siphons. SKULL/SOFT TISSUES: No skull fracture. No significant soft tissue swelling.  CT MAXILLOFACIAL FINDINGS FACIAL BONES/SOFT TISSUES: Acute nondisplaced distal bilateral nasal bone fractures. Mandible is intact and the condyles are located. Patient is edentulous. Moderate RIGHT temporomandibular osteoarthrosis. No destructive bony lesions. RIGHT periorbital and RIGHT pre malar soft tissue swelling and subcutaneous fat stranding without subcutaneous gas or radiopaque foreign bodies. RIGHT periorbital soft tissue laceration. SINUSES: Paranasal sinuses are well aerated. Nasal septum is midline. ORBITS: Ocular globes and orbital contents are nonsuspicious, status post bilateral ocular lens implants. CT CERVICAL SPINE FINDINGS Cervical vertebral bodies and posterior elements intact and aligned with maintenance of the cervical lordosis. Moderate C3-4 thru C5-6 disc height loss, mild at C6-7 with uncovertebral hypertrophy and endplate spurring. C1-2 articulation maintained with moderate arthropathy, calcified apical ligament. No destructive bony lesions. Moderate to severe C3-4, C4-5, C5-6 and C6-7 neural foraminal narrowing. Subcentimeter RIGHT thyroid nodule is below size followup recommendations. No prevertebral soft tissue swelling. IMPRESSION: CT HEAD: No acute intracranial process. Stable appearance of the head including mild chronic small vessel ischemic disease and old bilateral basal ganglia lacunar infarcts. CT MAXILLOFACIAL: Acute bilateral nondisplaced nasal bone fractures. RIGHT facial soft tissue swelling, and RIGHT periorbital soft tissue laceration. No postseptal hematoma. CT CERVICAL SPINE: No acute fracture or  malalignment. Electronically Signed   By: Awilda Metro M.D.   On: 12/17/2015 01:09    EKG: Orders placed or performed during the hospital encounter of 12/16/15  . ED EKG  . ED EKG  . EKG 12-Lead  . EKG 12-Lead  . EKG 12-Lead  . EKG 12-Lead    ASSESSMENT  Principal Problem:   Fall Active Problems:   Facial laceration   Hyponatremia   Pyuria ? UTI   Physical deconditioning  PLAN   1). Fall resulting in facial laceration and hemorrhage - Patient had a fall tonight that resulted in a laceration in the right temple. CT head is negative for any acute disorder and CT cervical spine does not show any fracture or dislocation. CT of macular sinus only shows soft tissue swelling and facial laceration. - Laceration has been sutured and will ensure pain control with morphine. - Monitor hemoglobin levels. At this time, hemoglobin is 11.2. - Fall precautions  2). Physical deconditioning - Consult physical therapy and initial fall precautions as noted above. - Check TSH. - Case management on board for possible placement  3). Hyponatremia - Patient sodium is 125. This may be from mild volume depletion. - Give gentle bolus of 500 mils of normal saline. - Monitor sodium levels.  4). Pyuria ? UTI - Patient's urinalysis is consistent with pyuria with a rare bacteria. Patient reports occasional dysuria. - Urine cultures have been obtained in September just been in the ED. On a precautionary note, I'll continue ceftriaxone until results of urine cultures obtained.  All the records are reviewed and case discussed with ED provider. Management plans discussed with the patient, family and they are in agreement.  CODE STATUS: Code Status History    This patient does not have a recorded code status. Please follow your organizational policy for patients in this situation.    Advance Directive Documentation        Most Recent Value   Type of Advance Directive  Healthcare Power of Attorney    Pre-existing out of facility DNR order (yellow form or pink MOST form)     "MOST" Form in Place?        I have independently reviewed all EKG and chest x-ray data  VTE prophylaxis: Heparin if no  contraindications and patient at low risk for bleeding. SCD's and progressive ambulation if patient has contraindications to anticoagulants. No DVT prophylaxis if patient presently receiving therapeutic anticoagulation or is at significant risk of bleeding for which the risk of anticoagulation outweigh the potential benefits.  Vaccinations: Pneumonia & flu vaccine per hospital protocol Prevention: Will proceed with conservative measures for the prevention of delirium in patients older than 65. Fall precautions and 1:1 sitter as needed per hospital protocol.  TOTAL TIME TAKING CARE OF THIS PATIENT: 40 minutes.    Robley Fries M.D on 12/17/2015 at 3:43 AM  Between 7am to 6pm - Pager - [phone]  After 6pm go to [email or web address] - password [email or web address] [facility]  [facility]  Office  [phone]  CC: Primary care physician; Lorie Phenix, MD

## 2015-12-17 NOTE — Evaluation (Signed)
Physical Therapy Evaluation Patient Details Name: Katrina Guerrero MRN: [id number] DOB: 26-Feb-1929 Today's Date: 12/17/2015   History of Present Illness  82 y.o. female with a known history of Hypertension, GERD, hyperlipidemia, hypothyroidism, anxiety, glaucoma.  Pt had a fall at night with R facial laceration, she does not recall how it happened but assumes she was getting up to go to the bathroom.    Clinical Impression  Pt is hesitant to do much with PT and when she does get up and do some mobility she lacks confidence and generally does not do as well as she had hoped.  Pt had not been wanting to go to rehab, but after her limited ability to ambulation and how weak she generally felt she agrees that she does need rehab (G-daughter agrees as well.).    Follow Up Recommendations SNF    Equipment Recommendations       Recommendations for Other Services       Precautions / Restrictions Precautions Precautions: Fall Restrictions Weight Bearing Restrictions: No      Mobility  Bed Mobility Overal bed mobility: Needs Assistance Bed Mobility: Supine to Sit;Sit to Supine     Supine to sit: Min assist;Mod assist Sit to supine: Min assist;Mod assist      Transfers Overall transfer level: Needs assistance Equipment used: Rolling walker (2 wheeled) Transfers: Sit to/from Stand Sit to Stand: Min assist         General transfer comment: Pt needing some assist getting up and generally is slow, guarded and weak.  Ambulation/Gait Ambulation/Gait assistance: Min assist Ambulation Distance (Feet): 15 Feet Assistive device: Rolling walker (2 wheeled)       General Gait Details: Pt is able to do a little bit of walking but feels weak, and is very guarded.  She does rely on the walker and though she has no LOBs is not at her baseline, fatigues very quickly and is generally not safe  Information systems managertairs            Wheelchair Mobility    Modified Rankin (Stroke Patients Only)        Balance                                             Pertinent Vitals/Pain Pain Assessment:  (reports only moderate soreness at right eye)    Home Living Family/patient expects to be discharged to:: Private residence Living Arrangements: Alone Available Help at Discharge: Family (son lives next door, daughter is very close as well) Type of Home: House Home Access: Stairs to enter   Secretary/administratorntrance Stairs-Number of Steps: 2   Home Equipment: Walker - 2 wheels      Prior Function Level of Independence: Independent         Comments: Pt is rarely out of the home, apparently adamant about staying at home.      Hand Dominance        Extremity/Trunk Assessment   Upper Extremity Assessment: Generalized weakness (grossly 3+ to 4-/5 with UE activity)           Lower Extremity Assessment: Generalized weakness (grossly 4-/5 t/o b/l LEs )         Communication   Communication: No difficulties  Cognition Arousal/Alertness: Lethargic Behavior During Therapy: Anxious Overall Cognitive Status: Within Functional Limits for tasks assessed  General Comments      Exercises        Assessment/Plan    PT Assessment Patient needs continued PT services  PT Diagnosis Difficulty walking;Generalized weakness   PT Problem List Decreased strength;Decreased activity tolerance;Decreased balance;Decreased safety awareness;Decreased mobility;Pain  PT Treatment Interventions DME instruction;Gait training;Stair training;Functional mobility training;Therapeutic activities;Therapeutic exercise;Balance training;Neuromuscular re-education;Patient/family education   PT Goals (Current goals can be found in the Care Plan section) Acute Rehab PT Goals Patient Stated Goal: "I just want to go home" PT Goal Formulation: With patient/family Time For Goal Achievement: 12/17/15 Potential to Achieve Goals: Fair    Frequency Min 2X/week   Barriers to  discharge        Co-evaluation               End of Session Equipment Utilized During Treatment: Gait belt Activity Tolerance: Patient limited by fatigue Patient left: with family/visitor present;with bed alarm set;with call bell/phone within reach      Functional Assessment Tool Used: clinical judgement Functional Limitation: Mobility: Walking and moving around Mobility: Walking and Moving Around Current Status (E4540): At least 60 percent but less than 80 percent impaired, limited or restricted Mobility: Walking and Moving Around Goal Status [phone]): At least 20 percent but less than 40 percent impaired, limited or restricted    Time: 0813-0830 PT Time Calculation (min) (ACUTE ONLY): 17 min   Charges:   PT Evaluation $PT Eval Low Complexity: 1 Procedure     PT G Codes:   PT G-Codes **NOT FOR INPATIENT CLASS** Functional Assessment Tool Used: clinical judgement Functional Limitation: Mobility: Walking and moving around Mobility: Walking and Moving Around Current Status (J4782): At least 60 percent but less than 80 percent impaired, limited or restricted Mobility: Walking and Moving Around Goal Status [phone]): At least 20 percent but less than 40 percent impaired, limited or restricted   Loran Senters, PT, DPT [phone]  Malachi Pro 12/17/2015, 11:09 AM

## 2015-12-17 NOTE — ED Notes (Signed)
LET applied per verbal order/nursing communication

## 2015-12-17 NOTE — ED Notes (Signed)
MD at bedside. 

## 2015-12-17 NOTE — ED Notes (Signed)
Called lab to add on UC 

## 2015-12-17 NOTE — Care Management Important Message (Signed)
Important Message  Patient Details  Name: Katrina Guerrero MRN: [id number] Date of Birth: [birth year]   Medicare Important Message Given:  Yes    Geraldyne Barraclough A, RN 12/17/2015, 1:45 PM

## 2015-12-17 NOTE — ED Notes (Signed)
Blood cleaned from face and wound

## 2015-12-17 NOTE — ED Provider Notes (Signed)
[facility] Emergency Department Provider Note  ____________________________________________  Time seen: Approximately 12:24 AM  I have reviewed the triage vital signs and the nursing notes.   HISTORY  Chief Complaint Fall  The history was obtained from the patient's daughter.  HPI Katrina Guerrero is a 82 y.o. female who comes into the hospital today with a fall at home. The patient was getting into bed and she was at home alone. The patient lives at home alone. The family reports that she was found right there on the side neck supple bed. The family reports that there was a lot of blood on the floor but they're not sure if she hit her head or her face. She does not walk well and shuffles typically with her walker. The family reports that in the past she's missed a step and has fallen. The patient pressed her life alert alarm and that's how EMS was contacted. The patient's son lives next door and was able to go over to open the door. The patient reports that she does not remember what occurred tonight. The family denies a history of dementia but reports that given her age she may have some mild dementia. When asked where the most of her pain as the patient is not answering.The patient was brought right in for evaluation.   Past Medical History  Diagnosis Date  . Hypertension   . Hyperlipidemia   . GERD (gastroesophageal reflux disease)   . Thyroid disease   . Anxiety   . Glaucoma   . Dyskinesia, tardive 02/23/2015    Mild (tongue).  Possibly related to previous use of Reglan.     Patient Active Problem List   Diagnosis Date Noted  . Hyperkalemia 06/26/2015  . Allergic rhinitis 02/23/2015  . Anxiety 02/23/2015  . Atrophic vaginitis 02/23/2015  . Chronic glaucoma 02/23/2015  . Clinical depression 02/23/2015  . Acid reflux 02/23/2015  . BP (high blood pressure) 02/23/2015  . Hypercholesteremia 02/23/2015  . Blood glucose elevated 02/23/2015  . High  potassium 02/23/2015  . Adult hypothyroidism 02/23/2015  . Cannot sleep 02/23/2015  . Dyskinesia, tardive 02/23/2015    Past Surgical History  Procedure Laterality Date  . Tubal ligation    . Cataract extraction Bilateral     Current Outpatient Rx  Name  Route  Sig  Dispense  Refill  . acetaminophen (TYLENOL) 500 MG tablet   Oral   Take 1,000 mg by mouth every 6 (six) hours as needed.         Marland Kitchen. aspirin 81 MG tablet   Oral   Take 81 mg by mouth daily.          . baclofen (LIORESAL) 10 MG tablet      TAKE ONE TABLET BY MOUTH EACH EVENING AS DIRECTED Patient taking differently: take 0.5 tablet qpm   30 each   5   . busPIRone (BUSPAR) 10 MG tablet      1 tablet mid-day and then 2 at night.   270 tablet   1   . felodipine (PLENDIL) 5 MG 24 hr tablet   Oral   Take 5 mg by mouth daily.          Marland Kitchen. levothyroxine (LEVOXYL) 50 MCG tablet   Oral   Take 1 tablet (50 mcg total) by mouth daily before breakfast.   90 tablet   3   . lisinopril (PRINIVIL,ZESTRIL) 10 MG tablet   Oral   Take 1 tablet (10 mg total)  by mouth daily.   90 tablet   1   . Menthol-Methyl Salicylate (MUSCLE RUB EX)               . mirtazapine (REMERON) 15 MG tablet   Oral   Take 1 tablet (15 mg total) by mouth at bedtime.   30 tablet   5   . Multiple Vitamin (MULTI-VITAMINS) TABS   Oral   Take 1 tablet by mouth daily.         Marland Kitchen nystatin (MYCOSTATIN/NYSTOP) 100000 UNIT/GM POWD               . omeprazole (PRILOSEC) 20 MG capsule      TAKE ONE (1) CAPSULE BY MOUTH 2 TIMES DAILY   180 capsule   1   . pravastatin (PRAVACHOL) 10 MG tablet   Oral   Take 1 tablet (10 mg total) by mouth daily.   90 tablet   1   . timolol (TIMOPTIC) 0.5 % ophthalmic solution   Ophthalmic   Apply to eye.           Allergies Codeine; Furosemide; and Levofloxacin  Family History  Problem Relation Age of Onset  . Hypertension Mother   . Heart attack Father   . Emphysema Brother      Social History Social History  Substance Use Topics  . Smoking status: Never Smoker   . Smokeless tobacco: Never Used  . Alcohol Use: No    Review of Systems Constitutional: No fever/chills Eyes: No visual changes. ENT: No sore throat. Cardiovascular: Denies chest pain. Respiratory: Denies shortness of breath. Gastrointestinal: No abdominal pain.  No nausea, no vomiting.  No diarrhea.  No constipation. Genitourinary: Negative for dysuria. Musculoskeletal: Negative for back pain. Skin: Right facial contusion Neurological: Negative for headaches, focal weakness or numbness.  10-point ROS otherwise negative.  ____________________________________________   PHYSICAL EXAM:  VITAL SIGNS: ED Triage Vitals  Enc Vitals Group     BP 12/16/15 2354 177/55 mmHg     Pulse Rate 12/16/15 2354 69     Resp 12/16/15 2354 18     Temp 12/16/15 2354 98.1 F (36.7 C)     Temp Source 12/16/15 2354 Oral     SpO2 12/16/15 2354 98 %     Weight --      Height --      Head Cir --      Peak Flow --      Pain Score --      Pain Loc --      Pain Edu? --      Excl. in GC? --     Constitutional: Alert and oriented.Moderate distress. Eyes: Conjunctivae are normal. PERRL. EOMI. Head: Contusion to right thigh and right side of face with a laceration to the lateral portion of the patient's eye. Nose: No congestion/rhinnorhea. Mouth/Throat: Mucous membranes are moist.  Oropharynx non-erythematous. Neck: No cervical spine tenderness to palpation. Cardiovascular: Normal rate, regular rhythm. Grossly normal heart sounds.  Good peripheral circulation. Respiratory: Normal respiratory effort.  No retractions. Lungs CTAB. Gastrointestinal: Soft and nontender. No distention. Positive bowel sounds Musculoskeletal: No lower extremity tenderness nor edema.   Neurologic:  Normal speech and language. Patient not fully following commands so difficult to assess cranial nerves. Skin:  Laceration to lateral  right eye Psychiatric: Mood and affect are normal.   ____________________________________________   LABS (all labs ordered are listed, but only abnormal results are displayed)  Labs Reviewed  BASIC METABOLIC PANEL - Abnormal;  Notable for the following:    Sodium 125 (*)    Chloride 93 (*)    Glucose, Bld 102 (*)    Calcium 8.8 (*)    All other components within normal limits  CBC - Abnormal; Notable for the following:    Hemoglobin 11.2 (*)    HCT 33.5 (*)    All other components within normal limits  URINALYSIS COMPLETEWITH MICROSCOPIC ([facility] ONLY) - Abnormal; Notable for the following:    Color, Urine YELLOW (*)    APPearance TURBID (*)    Ketones, ur 1+ (*)    Hgb urine dipstick 1+ (*)    Protein, ur 30 (*)    Leukocytes, UA 3+ (*)    Bacteria, UA RARE (*)    All other components within normal limits  URINE CULTURE  TROPONIN I   ____________________________________________  EKG  ED ECG REPORT I, Rebecka Apley, the attending physician, personally viewed and interpreted this ECG.   Date: 12/16/2015  EKG Time: 2349  Rate: 66  Rhythm: normal sinus rhythm  Axis: Normal  Intervals:none  ST&T Change: None  ____________________________________________  RADIOLOGY  CT head and cervical spine: No acute intracranial process, stable appearance of the head including mild chronic small vessel ischemic disease and old bilateral basal ganglia lacunar infarcts, no acute fracture or malalignment of the cervical spine  CT maxillofacial: Acute bilateral nondisplaced nasal bone fractures, right facial soft tissue swelling and right periorbital soft tissue laceration, no post septal hematoma. ____________________________________________   PROCEDURES  Procedure(s) performed: please, see procedure note(s).   LACERATION REPAIR Performed by: Lucrezia Europe P Authorized by: Lucrezia Europe P Consent: Verbal consent obtained. Risks and benefits: risks, benefits and  alternatives were discussed Consent given by: patient Patient identity confirmed: provided demographic data Prepped and Draped in normal sterile fashion Wound explored  Laceration Location: lateral right eye  Laceration Length: 6 cm  No Foreign Bodies seen or palpated  Anesthesia: local infiltration  Local anesthetic: lidocaine 2 % with epinephrine  Anesthetic total: 3 ml  Irrigation method: syringe Amount of cleaning: standard  Skin closure: 5.0 Ethilon  Number of sutures: 12  Technique: simple interrupted and locked continuous  Patient tolerance: Patient tolerated the procedure well with no immediate complications.   Critical Care performed: No  ____________________________________________   INITIAL IMPRESSION / ASSESSMENT AND PLAN / ED COURSE  Pertinent labs & imaging results that were available during my care of the patient were reviewed by me and considered in my medical decision making (see chart for details).  This is an 82 year old female who fell this evening. The patient told me that she was unable to remember her fall. We did perform a CT scan of the patient's head, cervical spine and neck. I'll also check some blood and some urine to evaluate possible other causes side from balance for the patient's fall. The patient will be reassessed once I received her results.  The patient appears to have some hyponatremia as well as urinary tract infection. I will give the patient liter of normal saline as well as a dose of ceftriaxone. The patient did have her laceration sutured and she will be admitted to the hospitalist service. The patient has no further complaints or concerns at this time. I discussed the patient's care with her family who was at the bedside.   ____________________________________________   FINAL CLINICAL IMPRESSION(S) / ED DIAGNOSES  Final diagnoses:  Hyponatremia  Facial contusion, initial encounter  UTI (lower urinary tract  infection)   Facial laceration, initial encounter  Nasal bone fractures, closed, initial encounter      Rebecka Apley, MD 12/17/15 0320

## 2015-12-17 NOTE — Progress Notes (Signed)
Pt on telemetry.  MD gave order to discontinue

## 2015-12-18 ENCOUNTER — Inpatient Hospital Stay: Payer: Commercial Managed Care - HMO

## 2015-12-18 MED ORDER — TIMOLOL MALEATE 0.5 % OP SOLN: 1.0000 [drp] | Freq: Every day | OPHTHALMIC | Status: AC

## 2015-12-18 MED ORDER — ENOXAPARIN SODIUM 40 MG/0.4ML ~~LOC~~ SOLN: 40.0000 mg | SUBCUTANEOUS | Status: DC

## 2015-12-18 MED ORDER — CAMPHOR-MENTHOL-METHYL SAL 4-10-30 % EX CREA: 1.0000 "application " | TOPICAL_CREAM | Freq: Two times a day (BID) | CUTANEOUS | Status: AC | PRN

## 2015-12-18 MED ORDER — CEPHALEXIN 500 MG PO CAPS: 500.0000 mg | ORAL_CAPSULE | Freq: Three times a day (TID) | ORAL | Status: AC

## 2015-12-18 MED ORDER — ACETAMINOPHEN 500 MG PO TABS: 1000.0000 mg | ORAL_TABLET | Freq: Four times a day (QID) | ORAL | Status: DC | PRN

## 2015-12-18 MED ORDER — BACLOFEN 10 MG PO TABS: 5.0000 mg | ORAL_TABLET | Freq: Every day | ORAL | Status: AC

## 2015-12-18 MED ORDER — TRAMADOL HCL 50 MG PO TABS: 50.0000 mg | ORAL_TABLET | Freq: Two times a day (BID) | ORAL | Status: AC | PRN

## 2015-12-18 NOTE — Progress Notes (Signed)
Physical Therapy Treatment Patient Details Name: Katrina Guerrero MRN: [id number] DOB: 1929/01/22 Today's Date: 12/18/2015    History of Present Illness 82 y.o. female with a known history of Hypertension, GERD, hyperlipidemia, hypothyroidism, anxiety, glaucoma.  Pt had a fall at night with R facial laceration, she does not recall how it happened but assumes she was getting up to go to the bathroom.      PT Comments    Pt gradually progressing towards functional goals. She was able to increase ambulation to 60 ft with FWW and min A. Bed mobility and transfers with FWW also required min A. Cues given for hand placement, staying close to FWW, increase BOS and step length. She remains limited by weakness and fatigue and will benefit from continued skilled PT to progress towards PLOF.  Follow Up Recommendations  SNF     Equipment Recommendations       Recommendations for Other Services       Precautions / Restrictions Precautions Precautions: Fall Restrictions Weight Bearing Restrictions: No    Mobility  Bed Mobility Overal bed mobility: Needs Assistance Bed Mobility: Supine to Sit     Supine to sit: Min assist     General bed mobility comments: uses rail, assistance for getting trunk upright  Transfers Overall transfer level: Needs assistance Equipment used: Rolling walker (2 wheeled) Transfers: Sit to/from [employer] Sit to Stand: Min assist Stand pivot transfers: Min assist       General transfer comment: Cues for hand placement, anterior weight shifting  Ambulation/Gait Ambulation/Gait assistance: Min assist Ambulation Distance (Feet): 60 Feet Assistive device: Rolling walker (2 wheeled) Gait Pattern/deviations: Decreased stride length;Shuffle;Narrow base of support     General Gait Details: Pt slightly unsteady during ambulation with difficulty turning FWW. She had no LOB. Required frequent cues for increased step length, BOS and safety  awareness.   Stairs            Wheelchair Mobility    Modified Rankin (Stroke Patients Only)       Balance Overall balance assessment: Needs assistance;History of Falls Sitting-balance support: Bilateral upper extremity supported;Feet supported Sitting balance-Leahy Scale: Fair     Standing balance support: Bilateral upper extremity supported Standing balance-Leahy Scale: Fair Standing balance comment: static balance fair, no LOB during session, cues for knee ext to prevent buckling                    Cognition Arousal/Alertness: Awake/alert Behavior During Therapy: WFL for tasks assessed/performed Overall Cognitive Status: Within Functional Limits for tasks assessed                      Exercises Other Exercises Other Exercises: Pt ambulated 60 ft with FWW and min A with cues for increased step length, BOS and safety.  Other Exercises: Standing dynamic balance training x4 min with cues for knee ext, postural correction and increased BOS for improved balance. She had no LOB during session.    General Comments General comments (skin integrity, edema, etc.): bruising R face with stiches      Pertinent Vitals/Pain Pain Assessment: No/denies pain    Home Living                      Prior Function            PT Goals (current goals can now be found in the care plan section) Acute Rehab PT Goals PT Goal Formulation: With patient/family Time  For Goal Achievement: 12/31/15 Potential to Achieve Goals: Fair Progress towards PT goals: Progressing toward goals    Frequency  Min 2X/week    PT Plan Current plan remains appropriate    Co-evaluation             End of Session Equipment Utilized During Treatment: Gait belt Activity Tolerance: Patient limited by fatigue Patient left: in chair;with call bell/phone within reach;with chair alarm set;with family/visitor present     Time: [id number] PT Time Calculation (min) (ACUTE  ONLY): 25 min  Charges:  $Gait Training: 8-22 mins $Therapeutic Activity: 8-22 mins                    G Codes:      Adelene IdlerMindy Jo Laurieanne Galloway, PT, DPT  12/18/2015, 3:31 PM [phone]

## 2015-12-18 NOTE — Clinical Social Work Placement (Signed)
   CLINICAL SOCIAL WORK PLACEMENT  NOTE  Date:  12/18/2015  Patient Details  Name: Katrina Guerrero MRN: [id number] Date of Birth: 1929/01/13  Clinical Social Work is seeking post-discharge placement for this patient at the Skilled  Nursing Facility level of care (*CSW will initial, date and re-position this form in  chart as items are completed):  Yes   Patient/family provided with [facility] Clinical Social Work Department's list of facilities offering this level of care within the geographic area requested by the patient (or if unable, by the patient's family).  Yes   Patient/family informed of their freedom to choose among providers that offer the needed level of care, that participate in Medicare, Medicaid or managed care program needed by the patient, have an available bed and are willing to accept the patient.  Yes   Patient/family informed of [facility] ownership interest in [facility] and [facility], as well as of the fact that they are under no obligation to receive care at these facilities.  PASRR submitted to EDS on 12/18/15     PASRR number received on 12/18/15     Existing PASRR number confirmed on       FL2 transmitted to all facilities in geographic area requested by pt/family on 12/18/15     FL2 transmitted to all facilities within larger geographic area on       Patient informed that his/her managed care company has contracts with or will negotiate with certain facilities, including the following:        Yes   Patient/family informed of bed offers received.  Patient chooses bed at       Physician recommends and patient chooses bed at      Patient to be transferred to   on  .  Patient to be transferred to facility by       Patient family notified on   of transfer.  Name of family member notified:        PHYSICIAN       Additional Comment:    _______________________________________________ Haig ProphetMorgan, Malorie Bigford G, LCSW 12/18/2015, 11:14 AM

## 2015-12-18 NOTE — Discharge Summary (Addendum)
[facility] - [place] [facility]   PATIENT NAME: Katrina Guerrero    MR#:  [id number]  DATE OF BIRTH:  [birth year]  DATE OF ADMISSION:  12/16/2015 ADMITTING PHYSICIAN: Ihor Austin, MD  DATE OF DISCHARGE: No discharge date for patient encounter.  PRIMARY CARE PHYSICIAN: Lorie Phenix, MD   ADMISSION DIAGNOSIS:  Hyponatremia [E87.1] UTI (lower urinary tract infection) [N39.0] Facial laceration, initial encounter [S01.81XA] Facial contusion, initial encounter [S00.83XA] Nasal bone fractures, closed, initial encounter [S02.2XXA]  DISCHARGE DIAGNOSIS:  Principal Problem:   Fall Active Problems:   Facial laceration   Hyponatremia   Pyuria ? UTI   Physical deconditioning   UTI (lower urinary tract infection)   SECONDARY DIAGNOSIS:   Past Medical History  Diagnosis Date  . Hypertension   . Hyperlipidemia   . GERD (gastroesophageal reflux disease)   . Thyroid disease   . Anxiety   . Glaucoma   . Dyskinesia, tardive 02/23/2015    Mild (tongue).  Possibly related to previous use of Reglan.      ADMITTING HISTORY  HISTORY OF PRESENT ILLNESS: Katrina Guerrero is a 82 y.o. female with a known history of Hypertension, GERD, hyperlipidemia, hypothyroidism, anxiety, glaucoma antibiotics initiated. She presents to the ED today via EMS after she had a fall at home. Patient lives alone but close to her family. Patient said she went to bed tonight and woke up on the floor. It's unclear how she fell. She however thinks that she got up to go to the bathroom and fell but she is unsure. When she found herself on the floor she had a laceration near her right eye and she was bleeding on the floor. She was able to call EMS through life alert. EMS found her on the floor bleeding from her wound. She currently denies any pain but states that she has been generally weak for many months. She lives alone and is not open to the idea of living in a nursing facility. She denies any  fever, recent travel, new medications or changing old medications, use of blood thinners or over-the-counter medications. She also denies any sick contacts, cough, shortness of breath, chest pain, nausea, vomiting, diarrhea, bleeding from any orifice or change in appetite. She however endorses some occasional dysuria. Chart review reveals that patient has a history of tardive dyskinesia but there is no documentation of dementia.   On arrival in the ED, vital signs are stable and patient was noted to be bleeding from a laceration near her right eye. Laceration was prompted sutured with 9 stitches. Patient received 1 L of normal saline in the ED. CBC showed a hemoglobin was 11.2. CMP was significant for a sodium of 125. Urinalysis showed pyuria with rare bacteria. Troponin was negative and EKG showed normal sinus rhythm. CT of the head, cervical spine and maxilla showed no acute disorder in the brain nor fracture or dislocation of the cervical spine. However, patient had soft tissue swelling as well as laceration on the right side of her face. Urine cultures have been obtained and patient was given ceftriaxone examined in the ED due to suspicion of UTI. Patient will be admitted for observation due to fall, physical deconditioning, hyponatremia and facial laceration. She is DO NOT INTUBATE DO NOT RESUSCITATE per son and daughter at bedside.  HOSPITAL COURSE:   * Fall likely from her baseline balance problems and slow deconditioning. Weakness contributing by UTI and dehydration. Physical therapy has seen patient and skilled nursing facility recommended. Stable  for transfer.  * UTI On ceftriaxone in the hospital. Keflex after discharge.  * Worsening hyponatremia over chronic hyponatremia due to dehydration Improving with IV fluids and at baseline.  * Hypertension Continue home medications  * Chronic bilateral lower extremity pain. right greater than left Review of notes from Dr. Elease Hashimoto her primary  care physician's office. Patient has had chronic bilateral lower extremity pain right greater than left. Today she had some tenderness on the right hip and I checked an x-ray which showed some remote injury but no acute fractures or dislocations.  On DVT prophylaxis during hospital stay.  Patient will be transferred to skilled nursing facility for further physical therapy.   CONSULTS OBTAINED:     DRUG ALLERGIES:   Allergies  Allergen Reactions  . Codeine   . Furosemide     lowers blood pressure  . Levofloxacin     Other reaction(s): Dizziness Weakness    DISCHARGE MEDICATIONS:   Current Discharge Medication List    START taking these medications   Details  cephALEXin (KEFLEX) 500 MG capsule Take 1 capsule (500 mg total) by mouth 3 (three) times daily. Qty: 6 capsule, Refills: 0    traMADol (ULTRAM) 50 MG tablet Take 1 tablet (50 mg total) by mouth every 12 (twelve) hours as needed for severe pain. Qty: 30 tablet, Refills: 0      CONTINUE these medications which have CHANGED   Details  baclofen (LIORESAL) 10 MG tablet Take 0.5 tablets (5 mg total) by mouth at bedtime. Qty: 30 each, Refills: 0    Camphor-Menthol-Methyl Sal (MUSCLE RUB) 12-24-28 % CREA Apply 1 application topically 2 (two) times daily as needed (Leg pain).    timolol (TIMOPTIC) 0.5 % ophthalmic solution Place 1 drop into both eyes daily. Qty: 10 mL, Refills: 12      CONTINUE these medications which have NOT CHANGED   Details  acetaminophen (TYLENOL) 500 MG tablet Take 1,000 mg by mouth every 6 (six) hours as needed.    aspirin 81 MG tablet Take 81 mg by mouth daily.     busPIRone (BUSPAR) 10 MG tablet 1 tablet mid-day and then 2 at night. Qty: 270 tablet, Refills: 1   Associated Diagnoses: Anxiety    felodipine (PLENDIL) 5 MG 24 hr tablet Take 5 mg by mouth daily.     levothyroxine (LEVOXYL) 50 MCG tablet Take 1 tablet (50 mcg total) by mouth daily before breakfast. Qty: 90 tablet, Refills: 3    Associated Diagnoses: Hypothyroidism, unspecified hypothyroidism type    lisinopril (PRINIVIL,ZESTRIL) 10 MG tablet Take 1 tablet (10 mg total) by mouth daily. Qty: 90 tablet, Refills: 1   Associated Diagnoses: Essential hypertension    mirtazapine (REMERON) 15 MG tablet Take 1 tablet (15 mg total) by mouth at bedtime. Qty: 30 tablet, Refills: 5   Associated Diagnoses: Cannot sleep    Multiple Vitamin (MULTI-VITAMINS) TABS Take 1 tablet by mouth daily.    omeprazole (PRILOSEC) 20 MG capsule TAKE ONE (1) CAPSULE BY MOUTH 2 TIMES DAILY Qty: 180 capsule, Refills: 1   Associated Diagnoses: Gastroesophageal reflux disease, esophagitis presence not specified    pravastatin (PRAVACHOL) 10 MG tablet Take 1 tablet (10 mg total) by mouth daily. Qty: 90 tablet, Refills: 1   Associated Diagnoses: Hypercholesteremia      STOP taking these medications     nystatin (MYCOSTATIN/NYSTOP) 100000 UNIT/GM POWD         Today   VITAL SIGNS:  Blood pressure 144/52, pulse 58, temperature 97.6  F (36.4 C), temperature source Oral, resp. rate 18, height 5\' 3"  (1.6 m), weight 63.504 kg (140 lb), SpO2 100 %.  I/O:   Intake/Output Summary (Last 24 hours) at 12/18/15 1253 Last data filed at 12/18/15 0721  Gross per 24 hour  Intake   1155 ml  Output      0 ml  Net   1155 ml    PHYSICAL EXAMINATION:  Physical Exam  GENERAL:  82 y.o.-year-old patient lying in the bed with no acute distress.  LUNGS: Normal breath sounds bilaterally, no wheezing, rales,rhonchi or crepitation. No use of accessory muscles of respiration.  CARDIOVASCULAR: S1, S2 normal. No murmurs, rubs, or gallops.  ABDOMEN: Soft, non-tender, non-distended. Bowel sounds present. No organomegaly or mass.  NEUROLOGIC: Moves all 4 extremities. PSYCHIATRIC: The patient is alert and awake  SKIN: No obvious rash, lesion, or ulcer.  Right periorbital hematoma. Sutures present  DATA REVIEW:   CBC  Recent Labs Lab 12/17/15 0031   WBC 9.5  HGB 11.2*  HCT 33.5*  PLT 229    Chemistries   Recent Labs Lab 12/17/15 0859  NA 132*  K 4.2  CL 104  CO2 23  GLUCOSE 113*  BUN 13  CREATININE 0.71  CALCIUM 8.4*  AST 18  ALT 9*  ALKPHOS 52  BILITOT 0.5    Cardiac Enzymes  Recent Labs Lab 12/17/15 0031  TROPONINI <0.03    Microbiology Results  Results for orders placed or performed during the hospital encounter of 12/16/15  Urine culture     Status: None (Preliminary result)   Collection Time: 12/17/15  2:43 AM  Result Value Ref Range Status   Specimen Description URINE, RANDOM  Final   Special Requests NONE  Final   Culture   Final    >=100,000 COLONIES/mL GROUP B STREP(S.AGALACTIAE)ISOLATED Virtually 100% of S. agalactiae (Group B) strains are susceptible to Penicillin.  For Penicillin-allergic patients, Erythromycin (85-95% sensitive) and Clindamycin (80% sensitive) are drugs of choice. Contact microbiology lab to request sensitivities if  needed within 7 days.    Report Status PENDING  Incomplete    RADIOLOGY:  Ct Head Wo Contrast  12/17/2015  CLINICAL DATA:  Unwitnessed fall tonight. Large RIGHT eye hematoma. Occipital hematoma. History of hypertension and hyperlipidemia. EXAM: CT HEAD WITHOUT CONTRAST CT MAXILLOFACIAL WITHOUT CONTRAST CT CERVICAL SPINE WITHOUT CONTRAST TECHNIQUE: Multidetector CT imaging of the head, cervical spine, and maxillofacial structures were performed using the standard protocol without intravenous contrast. Multiplanar CT image reconstructions of the cervical spine and maxillofacial structures were also generated. COMPARISON:  CT head December 11, 2010 FINDINGS: CT HEAD FINDINGS INTRACRANIAL CONTENTS: The ventricles and sulci are normal for age. No intraparenchymal hemorrhage, mass effect nor midline shift. Patchy supratentorial white matter hypodensities are within normal range for patient's age and though non-specific likely represent chronic small vessel ischemic disease. Old  bilateral basal ganglia lacunar infarcts. No acute large vascular territory infarcts. No abnormal extra-axial fluid collections. Basal cisterns are patent. Moderate to severe calcific atherosclerosis of the carotid siphons. SKULL/SOFT TISSUES: No skull fracture. No significant soft tissue swelling. CT MAXILLOFACIAL FINDINGS FACIAL BONES/SOFT TISSUES: Acute nondisplaced distal bilateral nasal bone fractures. Mandible is intact and the condyles are located. Patient is edentulous. Moderate RIGHT temporomandibular osteoarthrosis. No destructive bony lesions. RIGHT periorbital and RIGHT pre malar soft tissue swelling and subcutaneous fat stranding without subcutaneous gas or radiopaque foreign bodies. RIGHT periorbital soft tissue laceration. SINUSES: Paranasal sinuses are well aerated. Nasal septum is midline. ORBITS: Ocular globes  and orbital contents are nonsuspicious, status post bilateral ocular lens implants. CT CERVICAL SPINE FINDINGS Cervical vertebral bodies and posterior elements intact and aligned with maintenance of the cervical lordosis. Moderate C3-4 thru C5-6 disc height loss, mild at C6-7 with uncovertebral hypertrophy and endplate spurring. C1-2 articulation maintained with moderate arthropathy, calcified apical ligament. No destructive bony lesions. Moderate to severe C3-4, C4-5, C5-6 and C6-7 neural foraminal narrowing. Subcentimeter RIGHT thyroid nodule is below size followup recommendations. No prevertebral soft tissue swelling. IMPRESSION: CT HEAD: No acute intracranial process. Stable appearance of the head including mild chronic small vessel ischemic disease and old bilateral basal ganglia lacunar infarcts. CT MAXILLOFACIAL: Acute bilateral nondisplaced nasal bone fractures. RIGHT facial soft tissue swelling, and RIGHT periorbital soft tissue laceration. No postseptal hematoma. CT CERVICAL SPINE: No acute fracture or malalignment. Electronically Signed   By: Awilda Metro M.D.   On: 12/17/2015  01:09   Ct Cervical Spine Wo Contrast  12/17/2015  CLINICAL DATA:  Unwitnessed fall tonight. Large RIGHT eye hematoma. Occipital hematoma. History of hypertension and hyperlipidemia. EXAM: CT HEAD WITHOUT CONTRAST CT MAXILLOFACIAL WITHOUT CONTRAST CT CERVICAL SPINE WITHOUT CONTRAST TECHNIQUE: Multidetector CT imaging of the head, cervical spine, and maxillofacial structures were performed using the standard protocol without intravenous contrast. Multiplanar CT image reconstructions of the cervical spine and maxillofacial structures were also generated. COMPARISON:  CT head December 11, 2010 FINDINGS: CT HEAD FINDINGS INTRACRANIAL CONTENTS: The ventricles and sulci are normal for age. No intraparenchymal hemorrhage, mass effect nor midline shift. Patchy supratentorial white matter hypodensities are within normal range for patient's age and though non-specific likely represent chronic small vessel ischemic disease. Old bilateral basal ganglia lacunar infarcts. No acute large vascular territory infarcts. No abnormal extra-axial fluid collections. Basal cisterns are patent. Moderate to severe calcific atherosclerosis of the carotid siphons. SKULL/SOFT TISSUES: No skull fracture. No significant soft tissue swelling. CT MAXILLOFACIAL FINDINGS FACIAL BONES/SOFT TISSUES: Acute nondisplaced distal bilateral nasal bone fractures. Mandible is intact and the condyles are located. Patient is edentulous. Moderate RIGHT temporomandibular osteoarthrosis. No destructive bony lesions. RIGHT periorbital and RIGHT pre malar soft tissue swelling and subcutaneous fat stranding without subcutaneous gas or radiopaque foreign bodies. RIGHT periorbital soft tissue laceration. SINUSES: Paranasal sinuses are well aerated. Nasal septum is midline. ORBITS: Ocular globes and orbital contents are nonsuspicious, status post bilateral ocular lens implants. CT CERVICAL SPINE FINDINGS Cervical vertebral bodies and posterior elements intact and aligned  with maintenance of the cervical lordosis. Moderate C3-4 thru C5-6 disc height loss, mild at C6-7 with uncovertebral hypertrophy and endplate spurring. C1-2 articulation maintained with moderate arthropathy, calcified apical ligament. No destructive bony lesions. Moderate to severe C3-4, C4-5, C5-6 and C6-7 neural foraminal narrowing. Subcentimeter RIGHT thyroid nodule is below size followup recommendations. No prevertebral soft tissue swelling. IMPRESSION: CT HEAD: No acute intracranial process. Stable appearance of the head including mild chronic small vessel ischemic disease and old bilateral basal ganglia lacunar infarcts. CT MAXILLOFACIAL: Acute bilateral nondisplaced nasal bone fractures. RIGHT facial soft tissue swelling, and RIGHT periorbital soft tissue laceration. No postseptal hematoma. CT CERVICAL SPINE: No acute fracture or malalignment. Electronically Signed   By: Awilda Metro M.D.   On: 12/17/2015 01:09   Dg Hip Unilat With Pelvis 2-3 Views Right  12/18/2015  CLINICAL DATA:  82 year old female with fall and right hip pain. Initial encounter. EXAM: DG HIP (WITH OR WITHOUT PELVIS) 2-3V RIGHT COMPARISON:  None. FINDINGS: Femoral neck irregularity and adjacent heterotopic ossification suggesting remote injury/fracture. No acute fracture lines  are identified. Mild -moderate degenerative changes within the right hip noted. No focal bony lesions are noted. IMPRESSION: Irregularity of the femoral neck with adjacent heterotopic classification, suggesting remote injury. Acute bony injury is not identified but recommend MRI if there is strong clinical suspicion. Mild-moderate degenerative changes within the right hip. Electronically Signed   By: Harmon Pier M.D.   On: 12/18/2015 12:10   Ct Maxillofacial Wo Cm  12/17/2015  CLINICAL DATA:  Unwitnessed fall tonight. Large RIGHT eye hematoma. Occipital hematoma. History of hypertension and hyperlipidemia. EXAM: CT HEAD WITHOUT CONTRAST CT MAXILLOFACIAL  WITHOUT CONTRAST CT CERVICAL SPINE WITHOUT CONTRAST TECHNIQUE: Multidetector CT imaging of the head, cervical spine, and maxillofacial structures were performed using the standard protocol without intravenous contrast. Multiplanar CT image reconstructions of the cervical spine and maxillofacial structures were also generated. COMPARISON:  CT head December 11, 2010 FINDINGS: CT HEAD FINDINGS INTRACRANIAL CONTENTS: The ventricles and sulci are normal for age. No intraparenchymal hemorrhage, mass effect nor midline shift. Patchy supratentorial white matter hypodensities are within normal range for patient's age and though non-specific likely represent chronic small vessel ischemic disease. Old bilateral basal ganglia lacunar infarcts. No acute large vascular territory infarcts. No abnormal extra-axial fluid collections. Basal cisterns are patent. Moderate to severe calcific atherosclerosis of the carotid siphons. SKULL/SOFT TISSUES: No skull fracture. No significant soft tissue swelling. CT MAXILLOFACIAL FINDINGS FACIAL BONES/SOFT TISSUES: Acute nondisplaced distal bilateral nasal bone fractures. Mandible is intact and the condyles are located. Patient is edentulous. Moderate RIGHT temporomandibular osteoarthrosis. No destructive bony lesions. RIGHT periorbital and RIGHT pre malar soft tissue swelling and subcutaneous fat stranding without subcutaneous gas or radiopaque foreign bodies. RIGHT periorbital soft tissue laceration. SINUSES: Paranasal sinuses are well aerated. Nasal septum is midline. ORBITS: Ocular globes and orbital contents are nonsuspicious, status post bilateral ocular lens implants. CT CERVICAL SPINE FINDINGS Cervical vertebral bodies and posterior elements intact and aligned with maintenance of the cervical lordosis. Moderate C3-4 thru C5-6 disc height loss, mild at C6-7 with uncovertebral hypertrophy and endplate spurring. C1-2 articulation maintained with moderate arthropathy, calcified apical  ligament. No destructive bony lesions. Moderate to severe C3-4, C4-5, C5-6 and C6-7 neural foraminal narrowing. Subcentimeter RIGHT thyroid nodule is below size followup recommendations. No prevertebral soft tissue swelling. IMPRESSION: CT HEAD: No acute intracranial process. Stable appearance of the head including mild chronic small vessel ischemic disease and old bilateral basal ganglia lacunar infarcts. CT MAXILLOFACIAL: Acute bilateral nondisplaced nasal bone fractures. RIGHT facial soft tissue swelling, and RIGHT periorbital soft tissue laceration. No postseptal hematoma. CT CERVICAL SPINE: No acute fracture or malalignment. Electronically Signed   By: Awilda Metro M.D.   On: 12/17/2015 01:09    Follow up with PCP in 1 week.  Management plans discussed with the patient, family and they are in agreement.  CODE STATUS:     Code Status Orders        Start     Ordered   12/17/15 0505  Do not attempt resuscitation (DNR)   Continuous    Question Answer Comment  In the event of cardiac or respiratory ARREST Do not call a "code blue"   In the event of cardiac or respiratory ARREST Do not perform Intubation, CPR, defibrillation or ACLS   In the event of cardiac or respiratory ARREST Use medication by any route, position, wound care, and other measures to relive pain and suffering. May use oxygen, suction and manual treatment of airway obstruction as needed for comfort.  12/17/15 0504    Code Status History    Date Active Date Inactive Code Status Order ID Comments User Context   This patient has a current code status but no historical code status.    Advance Directive Documentation        Most Recent Value   Type of Advance Directive  Healthcare Power of Attorney   Pre-existing out of facility DNR order (yellow form or pink MOST form)     "MOST" Form in Place?        TOTAL TIME TAKING CARE OF THIS PATIENT ON DAY OF DISCHARGE: more than 30 minutes.   Milagros Loll R M.D on  12/18/2015 at 12:53 PM  Between 7am to 6pm - Pager - [phone]  After 6pm go to [email or web address] - password [email or web address] [facility]  [facility]  Office  [phone]  CC: Primary care physician; Lorie Phenix, MD  Note: This dictation was prepared with Dragon dictation along with smaller phrase technology. Any transcriptional errors that result from this process are unintentional.

## 2015-12-18 NOTE — [place] FL2 (Signed)
[place] MEDICAID FL2 LEVEL OF CARE SCREENING TOOL     IDENTIFICATION  Patient Name: Katrina Guerrero Birthdate: [birth year] Sex: female Admission Date (Current Location): 12/16/2015  [place]   Facility and Address:  [facility], [address], [place], [place] [address]      Provider Number: [id number]  Attending Physician Name and Address:  Milagros LollSrikar Sudini, MD  Relative Name and Phone Number:       Current Level of Care: Hospital Recommended Level of Care: Skilled Nursing Facility Prior Approval Number:    Date Approved/Denied:   PASRR Number:  [id number])  Discharge Plan: SNF    Current Diagnoses: Patient Active Problem List   Diagnosis Date Noted  . Fall 12/17/2015  . Facial laceration 12/17/2015  . Hyponatremia 12/17/2015  . Pyuria ? UTI 12/17/2015  . Physical deconditioning 12/17/2015  . UTI (lower urinary tract infection) 12/17/2015  . Hyperkalemia 06/26/2015  . Allergic rhinitis 02/23/2015  . Anxiety 02/23/2015  . Atrophic vaginitis 02/23/2015  . Chronic glaucoma 02/23/2015  . Clinical depression 02/23/2015  . Acid reflux 02/23/2015  . BP (high blood pressure) 02/23/2015  . Hypercholesteremia 02/23/2015  . Blood glucose elevated 02/23/2015  . High potassium 02/23/2015  . Adult hypothyroidism 02/23/2015  . Cannot sleep 02/23/2015  . Dyskinesia, tardive 02/23/2015    Orientation RESPIRATION BLADDER Height & Weight     Self, Time, Situation, Place  Normal Incontinent Weight: 140 lb (63.504 kg) Height:  5\' 3"  (160 cm)  BEHAVIORAL SYMPTOMS/MOOD NEUROLOGICAL BOWEL NUTRITION STATUS   (none )  (none ) Incontinent Diet (Diet: Heart Healthy )  AMBULATORY STATUS COMMUNICATION OF NEEDS Skin   Extensive Assist Verbally Other (Comment) (Laceration to right side of face. )                       Personal Care Assistance Level of Assistance  Bathing, Feeding, Dressing Bathing Assistance: Limited  assistance Feeding assistance: Independent Dressing Assistance: Limited assistance     Functional Limitations Info  Sight, Hearing, Speech Sight Info: Impaired Hearing Info: Impaired Speech Info: Adequate    SPECIAL CARE FACTORS FREQUENCY  PT (By licensed PT), OT (By licensed OT)     PT Frequency:  (5) OT Frequency:  (5)            Contractures      Additional Factors Info  Code Status, Allergies Code Status Info:  (DNR ) Allergies Info:  (Codeine, Furosemide, Levofloxacin)           Current Medications (12/18/2015):  This is the current hospital active medication list Current Facility-Administered Medications  Medication Dose Route Frequency Provider Last Rate Last Dose  . acetaminophen (TYLENOL) tablet 1,000 mg  1,000 mg Oral Q6H PRN Robley FriesAbdullahi Oseni, MD   1,000 mg at 12/17/15 1531  . aspirin EC tablet 81 mg  81 mg Oral Daily Robley FriesAbdullahi Oseni, MD   81 mg at 12/17/15 0803  . baclofen (LIORESAL) tablet 5 mg  5 mg Oral QPM Robley FriesAbdullahi Oseni, MD   5 mg at 12/17/15 1747  . busPIRone (BUSPAR) tablet 10 mg  10 mg Oral BID Robley FriesAbdullahi Oseni, MD   10 mg at 12/17/15 2107  . cefTRIAXone (ROCEPHIN) 1 g in dextrose 5 % 50 mL IVPB  1 g Intravenous Q24H Robley FriesAbdullahi Oseni, MD   1 g at 12/18/15 0702  . felodipine (PLENDIL) 24 hr tablet 5 mg  5 mg Oral Daily Robley FriesAbdullahi Oseni, MD  5 mg at 12/17/15 0803  . heparin injection 5,000 Units  5,000 Units Subcutaneous 3 times per day Robley Fries, MD   5,000 Units at 12/18/15 0703  . levothyroxine (SYNTHROID, LEVOTHROID) tablet 50 mcg  50 mcg Oral QAC breakfast Robley Fries, MD   50 mcg at 12/17/15 0802  . lisinopril (PRINIVIL,ZESTRIL) tablet 10 mg  10 mg Oral Daily Robley Fries, MD   10 mg at 12/17/15 0803  . mirtazapine (REMERON) tablet 15 mg  15 mg Oral QHS Robley Fries, MD   15 mg at 12/17/15 2107  . morphine 2 MG/ML injection 1 mg  1 mg Intravenous Q4H PRN Robley Fries, MD      . MUSCLE RUB CREA   Topical PRN Robley Fries, MD       . pantoprazole (PROTONIX) EC tablet 40 mg  40 mg Oral QAC breakfast Robley Fries, MD   40 mg at 12/17/15 0803  . pravastatin (PRAVACHOL) tablet 10 mg  10 mg Oral Daily Robley Fries, MD   10 mg at 12/17/15 0802  . timolol (TIMOPTIC) 0.5 % ophthalmic solution 1 drop  1 drop Both Eyes BID Robley Fries, MD   1 drop at 12/17/15 2107     Discharge Medications: Please see discharge summary for a list of discharge medications.  Relevant Imaging Results:  Relevant Lab Results:   Additional Information  (SSN: [id number])  Haig Prophet, LCSW

## 2015-12-18 NOTE — Progress Notes (Signed)
Per SW, Pt will D/C to [facility] this afternoon. EMS to transport. Family aware.

## 2015-12-18 NOTE — Clinical Social Work Note (Signed)
Clinical Social Work Assessment  Patient Details  Name: Katrina Guerrero MRN: [id number] Date of Birth: Apr 24, 1929  Date of referral:  12/18/15               Reason for consult:  Facility Placement                Permission sought to share information with:  Chartered certified accountant granted to share information::  Yes, Verbal Permission Granted  Name::      [place]::   [place]   Relationship::     Contact Information:     Housing/Transportation Living arrangements for the past 2 months:  [place] of Information:  Patient, Other (Comment Required) Actuary ) Patient Interpreter Needed:  None Criminal Activity/Legal Involvement Pertinent to Current Situation/Hospitalization:  No - Comment as needed Significant Relationships:  Adult Children, Other Family Members Lives with:  Self Do you feel safe going back to the place where you live?  Yes Need for family participation in patient care:  Yes (Comment)  Care giving concerns:  Patient lives in [place] alone.    Social Worker assessment / plan:  Holiday representative (CSW) received SNF consult. PT is recommending SNF. CSW met with patient and her granddaughter Nila Nephew was at bedside. Patient was alert and oriented and laying in the bed. CSW introduced self and explained role of CSW department. Patient reported that she lives in [place] alone and has a very supportive family. CSW explained SNF process and that [facility] will have to approve SNF stay. Patient and granddaughter are agreeable to SNF search and prefer [facility]. Per granddaughter patient's family will be touring [facility] today. CSW explained that if patient is ready for D/C today then the family and patient will have to make a SNF choice today. Granddaughter verbalized her understanding.   CSW met with patient and granddaughter and presented bed offers. Granddaughter reported that she would have to tour  [facility] and speak with family before making a decision. Per MD if patient's x-ray is good then she will D/C today. CSW contacted patient's daughter Jackelyn Poling and made her aware of above. Per Jackelyn Poling she is agreeable for patient to go to [facility] and stated that she will try to get the family to tour [facility] around 1 pm today. CSW left [facility] case manager a voicemail making her aware of above. CSW will continue to follow and assist as needed.   Employment status:  Retired Nurse, adult PT Recommendations:  [place] / Referral to community resources:  [employer]  Patient/Family's Response to care:  Patient and granddaughter are agreeable for SNF placement.   Patient/Family's Understanding of and Emotional Response to Diagnosis, Current Treatment, and Prognosis:  Patient was pleasant and thanked CSW for visit.   Emotional Assessment Appearance:  Appears stated age Attitude/Demeanor/Rapport:    Affect (typically observed):  Accepting, Adaptable, Pleasant Orientation:  Oriented to Self, Oriented to Place, Oriented to  Time, Oriented to Situation Alcohol / Substance use:  Not Applicable Psych involvement (Current and /or in the community):  No (Comment)  Discharge Needs  Concerns to be addressed:  Discharge Planning Concerns Readmission within the last 30 days:  No Current discharge risk:  Dependent with Mobility Barriers to Discharge:  Continued Medical Work up   Loralyn Freshwater, LCSW 12/18/2015, 11:37 AM

## 2015-12-18 NOTE — Progress Notes (Signed)
Patient's daughter Katrina Guerrero chose [facility]. Patient is medically stable for D/C to [facility] today. Per [name] [facility] liaison patient is going to room 612. RN will call report to [name] RN at [phone] and arrange EMS for transport. [facility] received. Auth # [id number]. Clinical Child psychotherapistocial Worker (CSW) sent D/C Summary, FL2 and D/C Packet to [employer] via [employer]. Patient is aware of above. Patient's daughter Katrina Guerrero is at bedside and aware of above. Please reconsult if future social work needs arise. CSW signing off.   Jetta LoutBailey Morgan, LCSW [phone]

## 2015-12-18 NOTE — Discharge Instructions (Signed)
°  DIET:  °Regular diet ° °DISCHARGE CONDITION:  °Stable ° °ACTIVITY:  °Activity as tolerated ° °OXYGEN:  °Home Oxygen: No. °  °Oxygen Delivery: room air ° °DISCHARGE LOCATION:  °nursing home  ° °If you experience worsening of your admission symptoms, develop shortness of breath, life threatening emergency, suicidal or homicidal thoughts you must seek medical attention immediately by calling 911 or calling your MD immediately  if symptoms less severe. ° °You Must read complete instructions/literature along with all the possible adverse reactions/side effects for all the Medicines you take and that have been prescribed to you. Take any new Medicines after you have completely understood and accpet all the possible adverse reactions/side effects.  ° °Please note ° °You were cared for by a hospitalist during your hospital stay. If you have any questions about your discharge medications or the care you received while you were in the hospital after you are discharged, you can call the unit and asked to speak with the hospitalist on call if the hospitalist that took care of you is not available. Once you are discharged, your primary care physician will handle any further medical issues. Please note that NO REFILLS for any discharge medications will be authorized once you are discharged, as it is imperative that you return to your primary care physician (or establish a relationship with a primary care physician if you do not have one) for your aftercare needs so that they can reassess your need for medications and monitor your lab values. ° ° ° °

## 2015-12-18 NOTE — Clinical Social Work Placement (Signed)
   CLINICAL SOCIAL WORK PLACEMENT  NOTE  Date:  12/18/2015  Patient Details  Name: Katrina Guerrero MRN: [id number] Date of Birth: 14-Dec-1928  Clinical Social Work is seeking post-discharge placement for this patient at the Skilled  Nursing Facility level of care (*CSW will initial, date and re-position this form in  chart as items are completed):  Yes   Patient/family provided with [place] Clinical Social Work Department's list of facilities offering this level of care within the geographic area requested by the patient (or if unable, by the patient's family).  Yes   Patient/family informed of their freedom to choose among providers that offer the needed level of care, that participate in Medicare, Medicaid or managed care program needed by the patient, have an available bed and are willing to accept the patient.  Yes   Patient/family informed of 's ownership interest in [facility] and [facility], as well as of the fact that they are under no obligation to receive care at these facilities.  PASRR submitted to EDS on 12/18/15     PASRR number received on 12/18/15     Existing PASRR number confirmed on       FL2 transmitted to all facilities in geographic area requested by pt/family on 12/18/15     FL2 transmitted to all facilities within larger geographic area on       Patient informed that his/her managed care company has contracts with or will negotiate with certain facilities, including the following:        Yes   Patient/family informed of bed offers received.  Patient chooses bed at  ([facility] )     Physician recommends and patient chooses bed at      Patient to be transferred to  ([facility] ) on 12/18/15.  Patient to be transferred to facility by  [facility] )     Patient family notified on 12/18/15 of transfer.  Name of family member notified:   (Patient's daughter Katrina Guerrero is at bedside and aware of D/C today. )     PHYSICIAN       Additional  Comment:    _______________________________________________ Haig ProphetMorgan, Zaida Reiland G, LCSW 12/18/2015, 3:01 PM

## 2015-12-19 LAB — URINE CULTURE

## 2015-12-22 ENCOUNTER — Ambulatory Visit: Payer: Commercial Managed Care - HMO | Admitting: Family Medicine

## 2016-01-02 ENCOUNTER — Ambulatory Visit: Payer: Commercial Managed Care - HMO | Admitting: Family Medicine

## 2016-01-12 ENCOUNTER — Telehealth: Payer: Self-pay | Admitting: Family Medicine

## 2016-01-12 NOTE — Telephone Encounter (Signed)
[facility] called requesting referral for pt [facility].Pt has [employer].Call back # [phone]

## 2016-01-12 NOTE — Telephone Encounter (Signed)
Ok to refer.  A little confused though,  Is she at [facility] and is she being taken care of by MD there. Thanks.

## 2016-01-16 NOTE — Telephone Encounter (Signed)
LMTCB 01/16/2016  Thanks,   Braylon Grenda  

## 2016-01-30 DIAGNOSIS — E039 Hypothyroidism, unspecified: Secondary | ICD-10-CM

## 2016-01-30 DIAGNOSIS — R531 Weakness: Secondary | ICD-10-CM | POA: Diagnosis not present

## 2016-01-30 DIAGNOSIS — F39 Unspecified mood [affective] disorder: Secondary | ICD-10-CM

## 2016-01-30 DIAGNOSIS — E1140 Type 2 diabetes mellitus with diabetic neuropathy, unspecified: Secondary | ICD-10-CM | POA: Diagnosis not present

## 2016-01-30 DIAGNOSIS — I10 Essential (primary) hypertension: Secondary | ICD-10-CM | POA: Diagnosis not present

## 2016-01-30 DIAGNOSIS — K219 Gastro-esophageal reflux disease without esophagitis: Secondary | ICD-10-CM | POA: Diagnosis not present

## 2016-02-06 DIAGNOSIS — R070 Pain in throat: Secondary | ICD-10-CM | POA: Diagnosis not present

## 2016-02-26 DIAGNOSIS — F39 Unspecified mood [affective] disorder: Secondary | ICD-10-CM

## 2016-02-26 DIAGNOSIS — K219 Gastro-esophageal reflux disease without esophagitis: Secondary | ICD-10-CM

## 2016-02-26 DIAGNOSIS — E441 Mild protein-calorie malnutrition: Secondary | ICD-10-CM

## 2016-02-26 DIAGNOSIS — I10 Essential (primary) hypertension: Secondary | ICD-10-CM | POA: Diagnosis not present

## 2016-02-26 DIAGNOSIS — M159 Polyosteoarthritis, unspecified: Secondary | ICD-10-CM

## 2016-02-26 DIAGNOSIS — M6281 Muscle weakness (generalized): Secondary | ICD-10-CM

## 2016-03-29 DIAGNOSIS — K219 Gastro-esophageal reflux disease without esophagitis: Secondary | ICD-10-CM

## 2016-03-29 DIAGNOSIS — E43 Unspecified severe protein-calorie malnutrition: Secondary | ICD-10-CM

## 2016-03-29 DIAGNOSIS — E039 Hypothyroidism, unspecified: Secondary | ICD-10-CM | POA: Diagnosis not present

## 2016-03-29 DIAGNOSIS — M6281 Muscle weakness (generalized): Secondary | ICD-10-CM | POA: Diagnosis not present

## 2016-03-29 DIAGNOSIS — M1990 Unspecified osteoarthritis, unspecified site: Secondary | ICD-10-CM | POA: Diagnosis not present

## 2016-03-29 DIAGNOSIS — F39 Unspecified mood [affective] disorder: Secondary | ICD-10-CM

## 2016-04-18 DIAGNOSIS — M159 Polyosteoarthritis, unspecified: Secondary | ICD-10-CM

## 2016-04-18 DIAGNOSIS — F39 Unspecified mood [affective] disorder: Secondary | ICD-10-CM

## 2016-04-18 DIAGNOSIS — E441 Mild protein-calorie malnutrition: Secondary | ICD-10-CM | POA: Diagnosis not present

## 2016-04-18 DIAGNOSIS — K219 Gastro-esophageal reflux disease without esophagitis: Secondary | ICD-10-CM

## 2016-04-18 DIAGNOSIS — I10 Essential (primary) hypertension: Secondary | ICD-10-CM | POA: Diagnosis not present

## 2016-04-24 DIAGNOSIS — M25532 Pain in left wrist: Secondary | ICD-10-CM

## 2016-06-19 DIAGNOSIS — E43 Unspecified severe protein-calorie malnutrition: Secondary | ICD-10-CM

## 2016-06-19 DIAGNOSIS — E039 Hypothyroidism, unspecified: Secondary | ICD-10-CM | POA: Diagnosis not present

## 2016-06-19 DIAGNOSIS — M1990 Unspecified osteoarthritis, unspecified site: Secondary | ICD-10-CM

## 2016-06-19 DIAGNOSIS — K219 Gastro-esophageal reflux disease without esophagitis: Secondary | ICD-10-CM | POA: Diagnosis not present

## 2016-06-19 DIAGNOSIS — F39 Unspecified mood [affective] disorder: Secondary | ICD-10-CM

## 2016-06-19 DIAGNOSIS — I10 Essential (primary) hypertension: Secondary | ICD-10-CM

## 2016-08-22 DIAGNOSIS — F39 Unspecified mood [affective] disorder: Secondary | ICD-10-CM | POA: Diagnosis not present

## 2016-08-22 DIAGNOSIS — K219 Gastro-esophageal reflux disease without esophagitis: Secondary | ICD-10-CM | POA: Diagnosis not present

## 2016-08-22 DIAGNOSIS — M159 Polyosteoarthritis, unspecified: Secondary | ICD-10-CM | POA: Diagnosis not present

## 2016-08-22 DIAGNOSIS — I10 Essential (primary) hypertension: Secondary | ICD-10-CM | POA: Diagnosis not present

## 2016-10-25 DIAGNOSIS — M1990 Unspecified osteoarthritis, unspecified site: Secondary | ICD-10-CM | POA: Diagnosis not present

## 2016-10-25 DIAGNOSIS — I10 Essential (primary) hypertension: Secondary | ICD-10-CM

## 2016-10-25 DIAGNOSIS — F323 Major depressive disorder, single episode, severe with psychotic features: Secondary | ICD-10-CM | POA: Diagnosis not present

## 2016-10-25 DIAGNOSIS — E039 Hypothyroidism, unspecified: Secondary | ICD-10-CM

## 2016-10-25 DIAGNOSIS — K219 Gastro-esophageal reflux disease without esophagitis: Secondary | ICD-10-CM | POA: Diagnosis not present

## 2016-12-09 DIAGNOSIS — M1711 Unilateral primary osteoarthritis, right knee: Secondary | ICD-10-CM

## 2016-12-13 ENCOUNTER — Telehealth: Payer: Self-pay | Admitting: Family Medicine

## 2016-12-13 NOTE — Telephone Encounter (Signed)
Called Pt to schedule AWV with [facility] - knb °

## 2016-12-19 DIAGNOSIS — I10 Essential (primary) hypertension: Secondary | ICD-10-CM

## 2016-12-19 DIAGNOSIS — F39 Unspecified mood [affective] disorder: Secondary | ICD-10-CM | POA: Diagnosis not present

## 2016-12-19 DIAGNOSIS — E039 Hypothyroidism, unspecified: Secondary | ICD-10-CM

## 2016-12-19 DIAGNOSIS — M159 Polyosteoarthritis, unspecified: Secondary | ICD-10-CM

## 2016-12-19 DIAGNOSIS — K219 Gastro-esophageal reflux disease without esophagitis: Secondary | ICD-10-CM

## 2017-01-16 DIAGNOSIS — H10021 Other mucopurulent conjunctivitis, right eye: Secondary | ICD-10-CM | POA: Diagnosis not present

## 2017-02-26 DIAGNOSIS — E039 Hypothyroidism, unspecified: Secondary | ICD-10-CM

## 2017-02-26 DIAGNOSIS — I10 Essential (primary) hypertension: Secondary | ICD-10-CM

## 2017-02-26 DIAGNOSIS — M1990 Unspecified osteoarthritis, unspecified site: Secondary | ICD-10-CM

## 2017-02-26 DIAGNOSIS — K219 Gastro-esophageal reflux disease without esophagitis: Secondary | ICD-10-CM

## 2017-02-26 DIAGNOSIS — F39 Unspecified mood [affective] disorder: Secondary | ICD-10-CM | POA: Diagnosis not present

## 2017-03-11 DIAGNOSIS — R609 Edema, unspecified: Secondary | ICD-10-CM

## 2017-04-17 DIAGNOSIS — M159 Polyosteoarthritis, unspecified: Secondary | ICD-10-CM

## 2017-04-17 DIAGNOSIS — I10 Essential (primary) hypertension: Secondary | ICD-10-CM | POA: Diagnosis not present

## 2017-04-17 DIAGNOSIS — F39 Unspecified mood [affective] disorder: Secondary | ICD-10-CM | POA: Diagnosis not present

## 2017-04-17 DIAGNOSIS — E039 Hypothyroidism, unspecified: Secondary | ICD-10-CM | POA: Diagnosis not present

## 2017-04-17 DIAGNOSIS — K219 Gastro-esophageal reflux disease without esophagitis: Secondary | ICD-10-CM | POA: Diagnosis not present

## 2017-06-17 IMAGING — CR DG HIP (WITH OR WITHOUT PELVIS) 2-3V*R*
3 series · 3 of 3 positions shown · non-contrast
Comparison: None.

CLINICAL DATA: 86-year-old female with fall and right hip pain.
Initial encounter.

EXAM:
DG HIP (WITH OR WITHOUT PELVIS) 2-3V RIGHT

[pelvis ap]
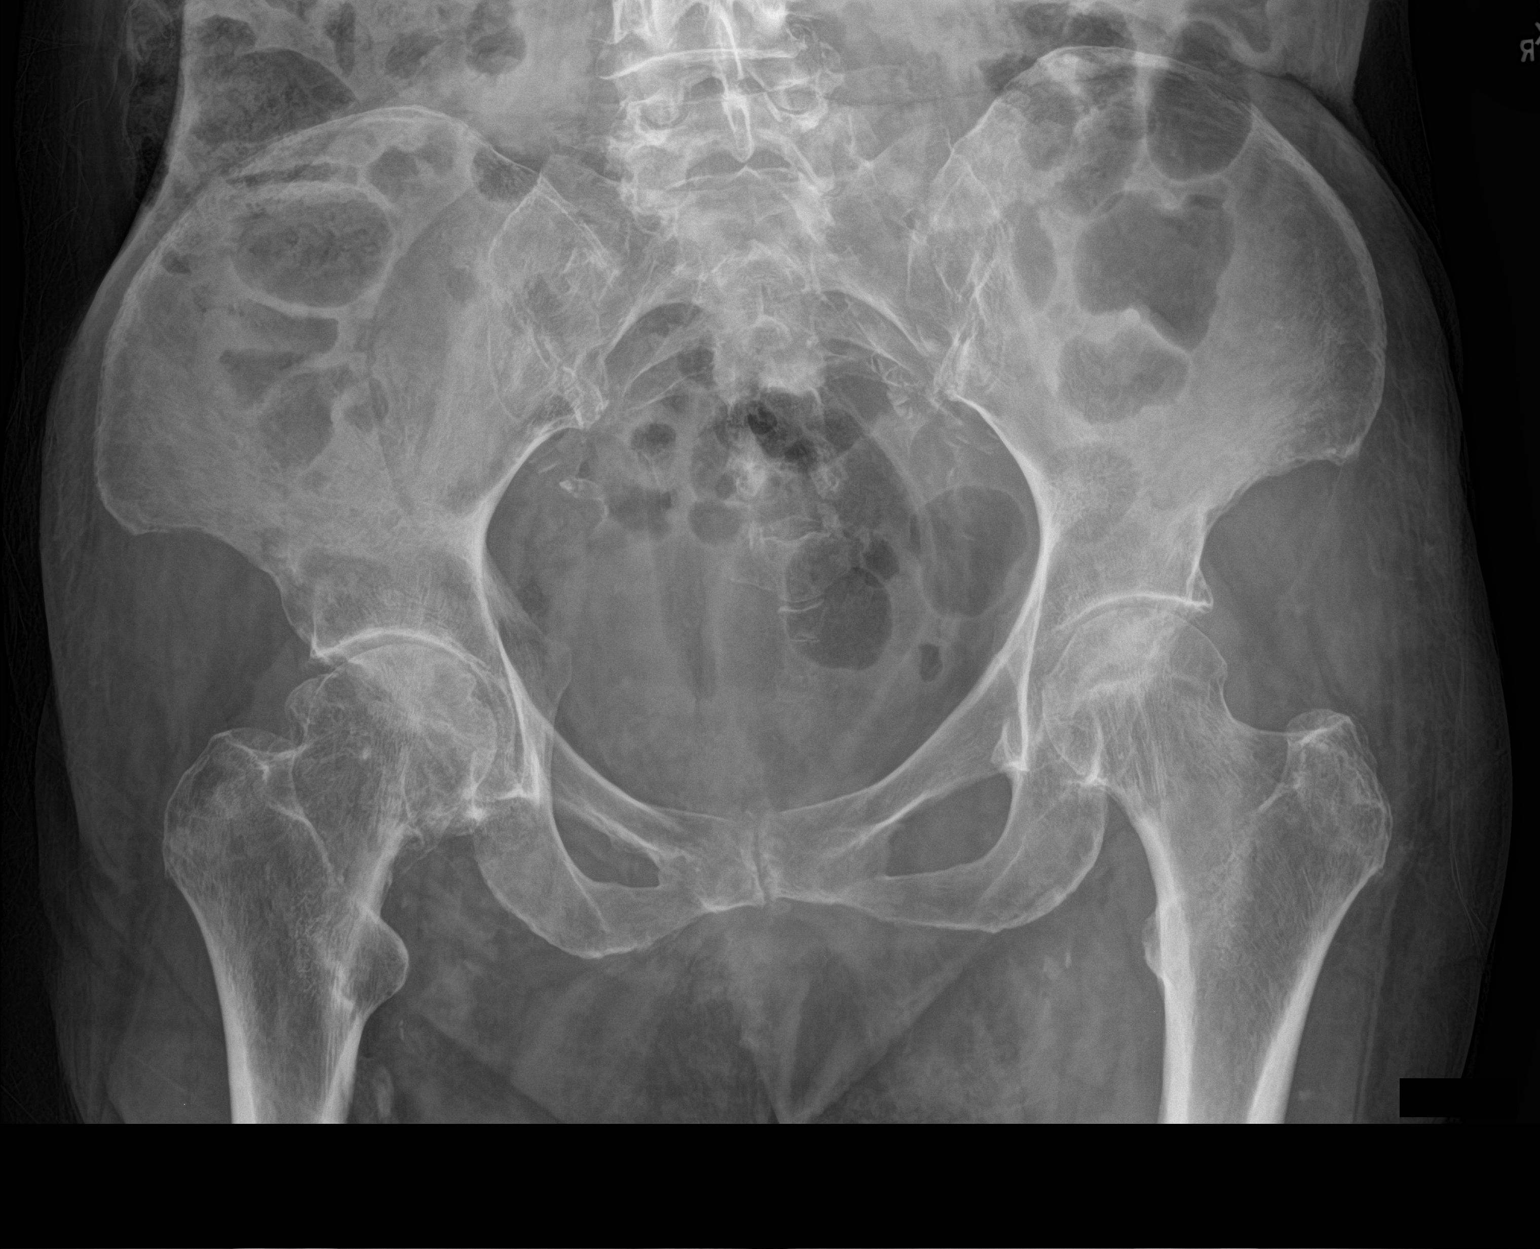

[hip ap]
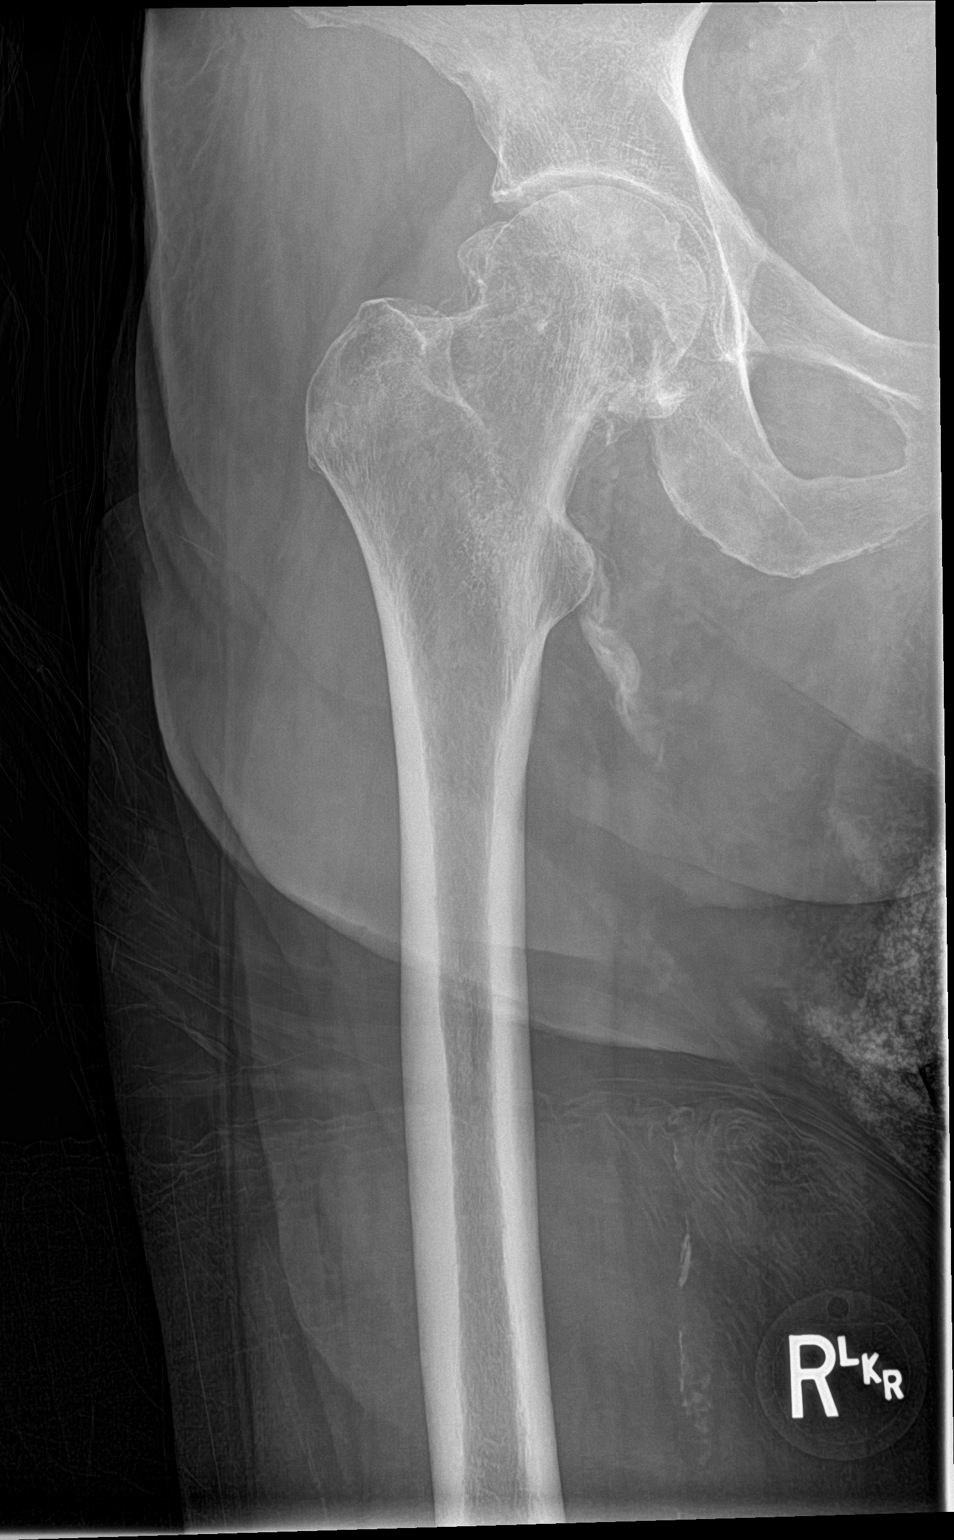

[hip lat]
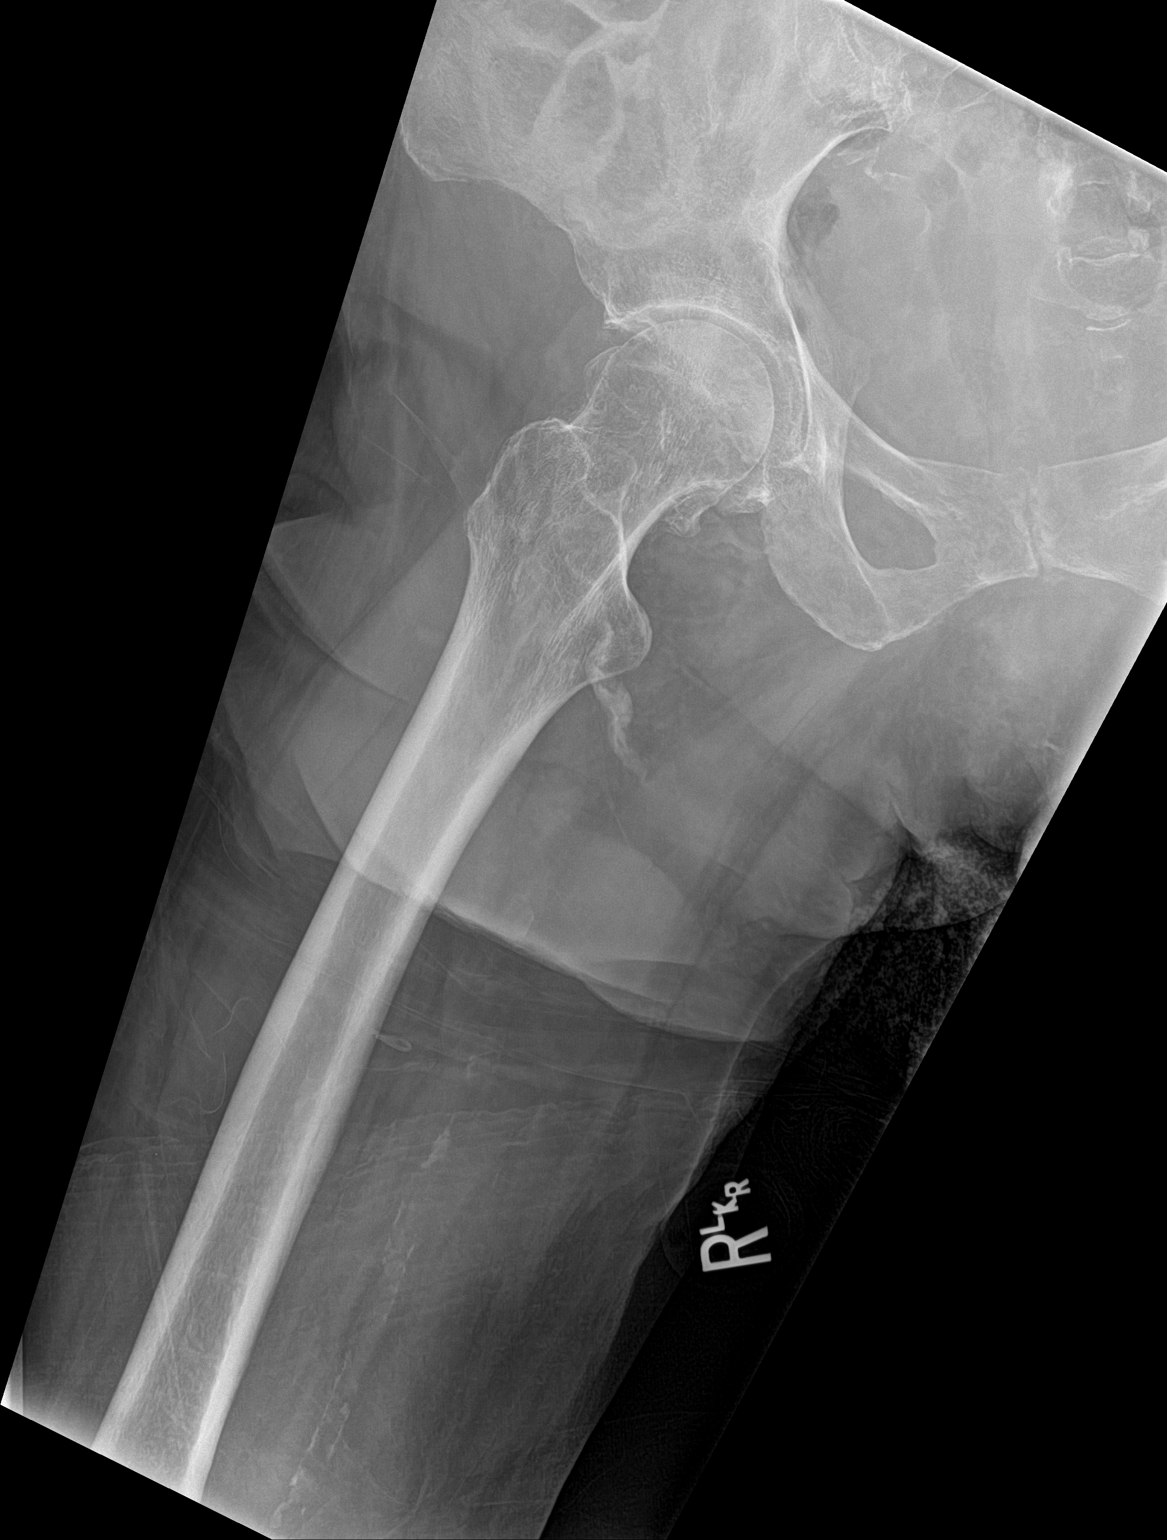

[3 of 3 positions shown; findings below may reference images not displayed]

FINDINGS: Femoral neck irregularity and adjacent heterotopic ossification
suggesting remote injury/fracture. No acute fracture lines are
identified.

Mild -moderate degenerative changes within the right hip noted.

No focal bony lesions are noted.
IMPRESSION: Irregularity of the femoral neck with adjacent heterotopic
classification, suggesting remote injury. Acute bony injury is not
identified but recommend MRI if there is strong clinical suspicion.

Mild-moderate degenerative changes within the right hip.

## 2017-06-20 DIAGNOSIS — K219 Gastro-esophageal reflux disease without esophagitis: Secondary | ICD-10-CM | POA: Diagnosis not present

## 2017-06-20 DIAGNOSIS — F39 Unspecified mood [affective] disorder: Secondary | ICD-10-CM | POA: Diagnosis not present

## 2017-06-20 DIAGNOSIS — M1990 Unspecified osteoarthritis, unspecified site: Secondary | ICD-10-CM | POA: Diagnosis not present

## 2017-06-20 DIAGNOSIS — I10 Essential (primary) hypertension: Secondary | ICD-10-CM | POA: Diagnosis not present

## 2017-06-20 DIAGNOSIS — E039 Hypothyroidism, unspecified: Secondary | ICD-10-CM | POA: Diagnosis not present

## 2017-08-21 DIAGNOSIS — K219 Gastro-esophageal reflux disease without esophagitis: Secondary | ICD-10-CM

## 2017-08-21 DIAGNOSIS — F39 Unspecified mood [affective] disorder: Secondary | ICD-10-CM | POA: Diagnosis not present

## 2017-08-21 DIAGNOSIS — I10 Essential (primary) hypertension: Secondary | ICD-10-CM | POA: Diagnosis not present

## 2017-08-21 DIAGNOSIS — M159 Polyosteoarthritis, unspecified: Secondary | ICD-10-CM | POA: Diagnosis not present

## 2017-08-21 DIAGNOSIS — E039 Hypothyroidism, unspecified: Secondary | ICD-10-CM

## 2017-09-24 DIAGNOSIS — B351 Tinea unguium: Secondary | ICD-10-CM

## 2017-10-17 DIAGNOSIS — I10 Essential (primary) hypertension: Secondary | ICD-10-CM | POA: Diagnosis not present

## 2017-10-17 DIAGNOSIS — E039 Hypothyroidism, unspecified: Secondary | ICD-10-CM | POA: Diagnosis not present

## 2017-10-17 DIAGNOSIS — K219 Gastro-esophageal reflux disease without esophagitis: Secondary | ICD-10-CM

## 2017-10-17 DIAGNOSIS — F39 Unspecified mood [affective] disorder: Secondary | ICD-10-CM | POA: Diagnosis not present

## 2017-10-17 DIAGNOSIS — M1990 Unspecified osteoarthritis, unspecified site: Secondary | ICD-10-CM | POA: Diagnosis not present

## 2017-12-16 DIAGNOSIS — M159 Polyosteoarthritis, unspecified: Secondary | ICD-10-CM | POA: Diagnosis not present

## 2017-12-16 DIAGNOSIS — F39 Unspecified mood [affective] disorder: Secondary | ICD-10-CM | POA: Diagnosis not present

## 2017-12-16 DIAGNOSIS — I10 Essential (primary) hypertension: Secondary | ICD-10-CM | POA: Diagnosis not present

## 2017-12-16 DIAGNOSIS — K219 Gastro-esophageal reflux disease without esophagitis: Secondary | ICD-10-CM | POA: Diagnosis not present

## 2017-12-16 DIAGNOSIS — G039 Meningitis, unspecified: Secondary | ICD-10-CM | POA: Diagnosis not present

## 2018-01-23 DIAGNOSIS — M1990 Unspecified osteoarthritis, unspecified site: Secondary | ICD-10-CM | POA: Diagnosis not present

## 2018-01-23 DIAGNOSIS — M25561 Pain in right knee: Secondary | ICD-10-CM | POA: Diagnosis not present

## 2018-02-02 DIAGNOSIS — J209 Acute bronchitis, unspecified: Secondary | ICD-10-CM | POA: Diagnosis not present

## 2018-02-25 DIAGNOSIS — I10 Essential (primary) hypertension: Secondary | ICD-10-CM | POA: Diagnosis not present

## 2018-02-25 DIAGNOSIS — M1990 Unspecified osteoarthritis, unspecified site: Secondary | ICD-10-CM

## 2018-02-25 DIAGNOSIS — K219 Gastro-esophageal reflux disease without esophagitis: Secondary | ICD-10-CM

## 2018-02-25 DIAGNOSIS — E039 Hypothyroidism, unspecified: Secondary | ICD-10-CM | POA: Diagnosis not present

## 2018-02-25 DIAGNOSIS — F39 Unspecified mood [affective] disorder: Secondary | ICD-10-CM

## 2018-04-16 DIAGNOSIS — M159 Polyosteoarthritis, unspecified: Secondary | ICD-10-CM | POA: Diagnosis not present

## 2018-04-16 DIAGNOSIS — E039 Hypothyroidism, unspecified: Secondary | ICD-10-CM

## 2018-04-16 DIAGNOSIS — I10 Essential (primary) hypertension: Secondary | ICD-10-CM | POA: Diagnosis not present

## 2018-04-16 DIAGNOSIS — F39 Unspecified mood [affective] disorder: Secondary | ICD-10-CM | POA: Diagnosis not present

## 2018-06-24 DIAGNOSIS — M1990 Unspecified osteoarthritis, unspecified site: Secondary | ICD-10-CM | POA: Diagnosis not present

## 2018-06-24 DIAGNOSIS — F39 Unspecified mood [affective] disorder: Secondary | ICD-10-CM

## 2018-06-24 DIAGNOSIS — K219 Gastro-esophageal reflux disease without esophagitis: Secondary | ICD-10-CM

## 2018-06-24 DIAGNOSIS — I10 Essential (primary) hypertension: Secondary | ICD-10-CM

## 2018-07-21 DIAGNOSIS — F39 Unspecified mood [affective] disorder: Secondary | ICD-10-CM | POA: Diagnosis not present

## 2018-07-21 DIAGNOSIS — E661 Drug-induced obesity: Secondary | ICD-10-CM | POA: Diagnosis not present

## 2018-08-18 DIAGNOSIS — E441 Mild protein-calorie malnutrition: Secondary | ICD-10-CM

## 2018-08-18 DIAGNOSIS — H6011 Cellulitis of right external ear: Secondary | ICD-10-CM

## 2018-08-18 DIAGNOSIS — I10 Essential (primary) hypertension: Secondary | ICD-10-CM | POA: Diagnosis not present

## 2018-08-18 DIAGNOSIS — M159 Polyosteoarthritis, unspecified: Secondary | ICD-10-CM | POA: Diagnosis not present

## 2018-08-26 DIAGNOSIS — M25561 Pain in right knee: Secondary | ICD-10-CM | POA: Diagnosis not present

## 2018-08-26 DIAGNOSIS — B351 Tinea unguium: Secondary | ICD-10-CM | POA: Diagnosis not present

## 2018-09-16 DEATH — deceased

## 2018-09-28 ENCOUNTER — Telehealth: Payer: Self-pay | Admitting: Internal Medicine

## 2018-09-28 NOTE — Telephone Encounter (Signed)
okay

## 2018-09-28 NOTE — Telephone Encounter (Signed)
Yes--- I have been waiting for it

## 2018-09-28 NOTE — Telephone Encounter (Signed)
Beth from [employer] is calling and wanting to know if Dr Alphonsus Sias will sign off on death certificate. Please advise

## 2018-09-28 NOTE — Telephone Encounter (Signed)
They'll mail the death certificate to Dr.Letvak.
